# Patient Record
Sex: Female | Born: 1935
Health system: Southern US, Community
[De-identification: ages and names within clinical notes are randomized; demographics above are authoritative.]

## PROBLEM LIST (undated history)

## (undated) DIAGNOSIS — I10 Essential (primary) hypertension: Secondary | ICD-10-CM

## (undated) DIAGNOSIS — I059 Rheumatic mitral valve disease, unspecified: Secondary | ICD-10-CM

## (undated) DIAGNOSIS — M810 Age-related osteoporosis without current pathological fracture: Secondary | ICD-10-CM

## (undated) DIAGNOSIS — F4310 Post-traumatic stress disorder, unspecified: Secondary | ICD-10-CM

## (undated) DIAGNOSIS — F32A Depression, unspecified: Secondary | ICD-10-CM

## (undated) DIAGNOSIS — R29898 Other symptoms and signs involving the musculoskeletal system: Secondary | ICD-10-CM

## (undated) DIAGNOSIS — K219 Gastro-esophageal reflux disease without esophagitis: Secondary | ICD-10-CM

## (undated) DIAGNOSIS — G4730 Sleep apnea, unspecified: Secondary | ICD-10-CM

## (undated) DIAGNOSIS — R251 Tremor, unspecified: Secondary | ICD-10-CM

## (undated) DIAGNOSIS — E7800 Pure hypercholesterolemia, unspecified: Secondary | ICD-10-CM

## (undated) DIAGNOSIS — G8929 Other chronic pain: Secondary | ICD-10-CM

## (undated) DIAGNOSIS — T753XXA Motion sickness, initial encounter: Secondary | ICD-10-CM

## (undated) DIAGNOSIS — Z8719 Personal history of other diseases of the digestive system: Secondary | ICD-10-CM

## (undated) DIAGNOSIS — Z8711 Personal history of peptic ulcer disease: Secondary | ICD-10-CM

## (undated) DIAGNOSIS — H9190 Unspecified hearing loss, unspecified ear: Secondary | ICD-10-CM

## (undated) DIAGNOSIS — M549 Dorsalgia, unspecified: Secondary | ICD-10-CM

## (undated) DIAGNOSIS — R011 Cardiac murmur, unspecified: Secondary | ICD-10-CM

## (undated) DIAGNOSIS — F329 Major depressive disorder, single episode, unspecified: Secondary | ICD-10-CM

## (undated) DIAGNOSIS — J449 Chronic obstructive pulmonary disease, unspecified: Secondary | ICD-10-CM

## (undated) DIAGNOSIS — J3089 Other allergic rhinitis: Secondary | ICD-10-CM

## (undated) DIAGNOSIS — K589 Irritable bowel syndrome without diarrhea: Secondary | ICD-10-CM

## (undated) DIAGNOSIS — IMO0001 Reserved for inherently not codable concepts without codable children: Secondary | ICD-10-CM

## (undated) DIAGNOSIS — E039 Hypothyroidism, unspecified: Secondary | ICD-10-CM

## (undated) DIAGNOSIS — C259 Malignant neoplasm of pancreas, unspecified: Secondary | ICD-10-CM

## (undated) HISTORY — PX: TONSILLECTOMY: SUR1361

## (undated) HISTORY — PX: EXCISION OF SKIN TAG: SHX6270

## (undated) HISTORY — PX: NASAL SEPTUM SURGERY: SHX37

## (undated) HISTORY — PX: BACK SURGERY: SHX140

## (undated) HISTORY — PX: MASTOIDECTOMY: SHX711

## (undated) HISTORY — PX: ABDOMINAL HYSTERECTOMY: SHX81

## (undated) HISTORY — PX: EYE SURGERY: SHX253

---

## 2007-10-29 ENCOUNTER — Inpatient Hospital Stay (HOSPITAL_COMMUNITY): Admission: RE | Admit: 2007-10-29 | Discharge: 2007-11-03 | Payer: Self-pay | Admitting: Neurosurgery

## 2007-10-29 IMAGING — CR DG LUMBAR SPINE COMPLETE 4+V
1 series · 1 of 1 positions shown · non-contrast
Comparison: none

CLINICAL DATA: L3 to S1 laminectomy and diskectomy with fusion. 
 LUMBAR SPINE - 4 VIEW:

[view not recorded]
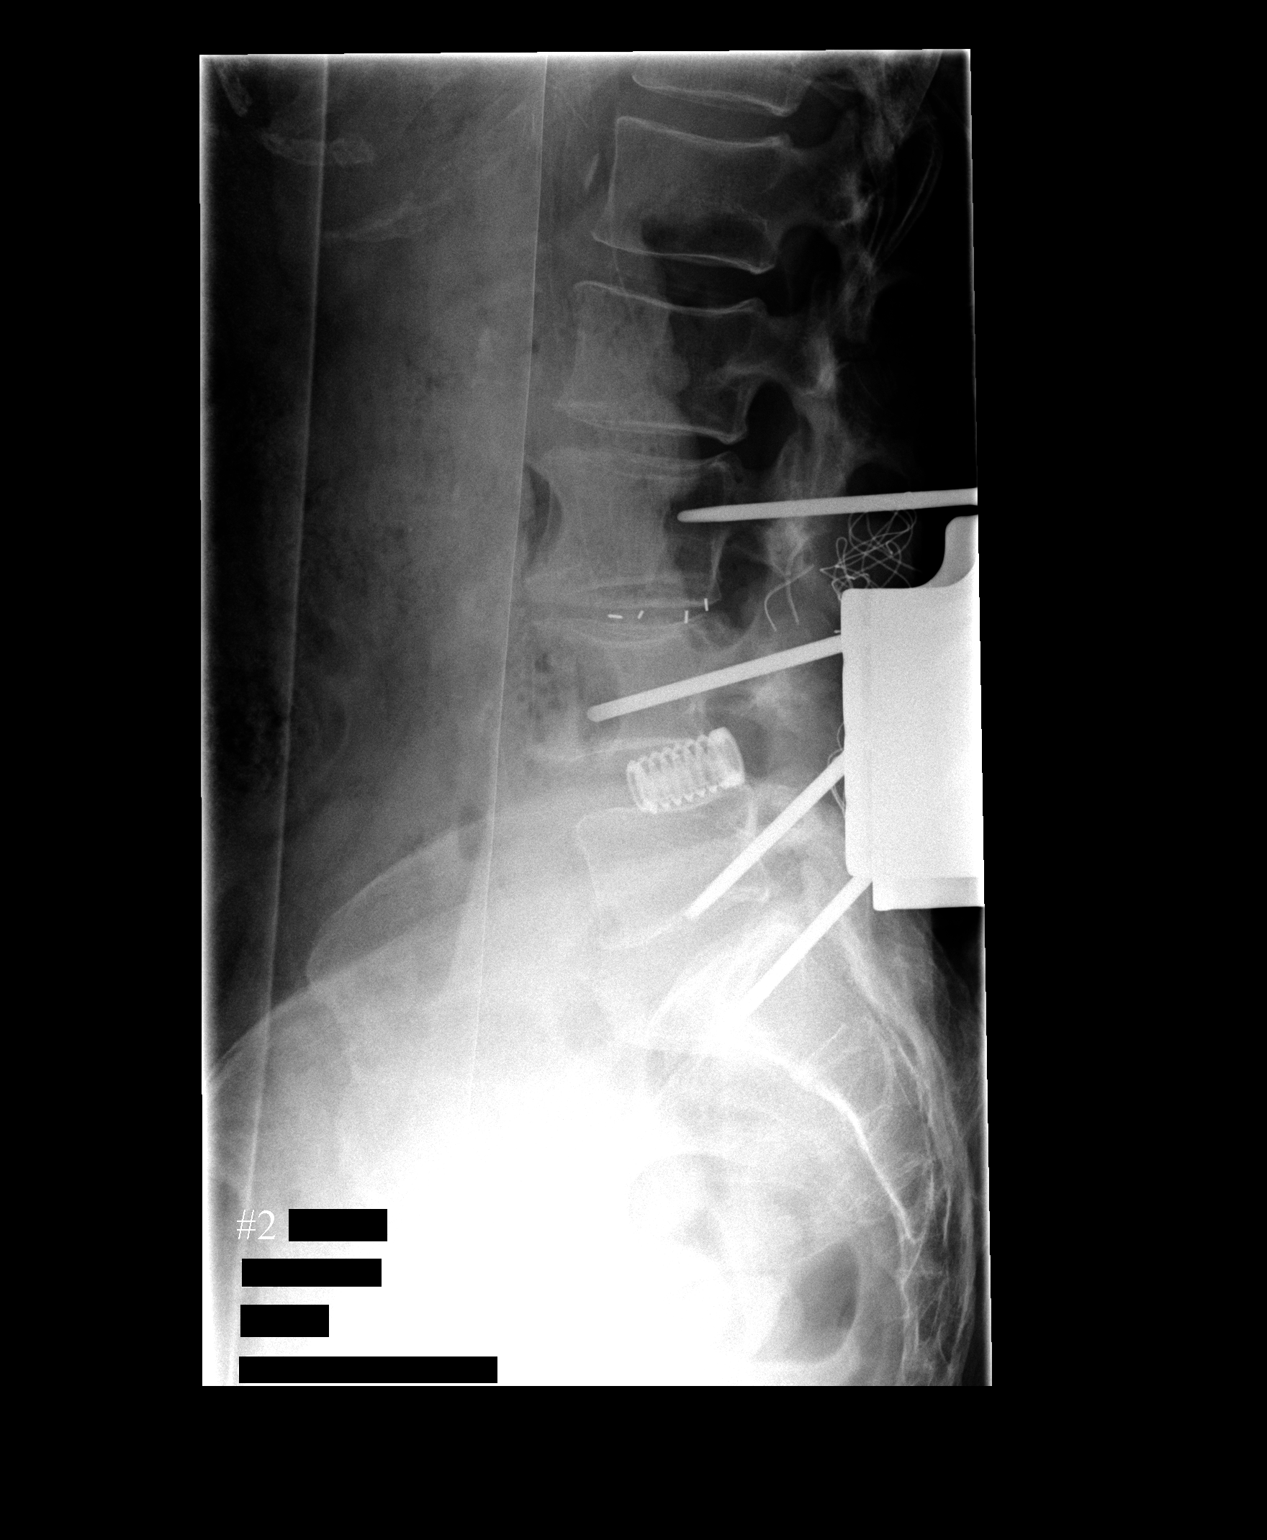

[1 of 1 positions shown; findings below may reference images not displayed]

FINDINGS: Four lateral views of the lumbar spine were returned from the operating room.  Film labeled #1 shows an instrument posteriorly positioned directed towards the L4-5 interspace.  A 2nd film shows introducers placed at L3, L4, L5 and S1 levels with Ray cage at L4-5 level.  A 3rd film shows additional transpedicular screws and introducer present at these levels.  A 4th film shows posterior fusion from L3 to S1 with Ray cage fusion at L4-5.
IMPRESSION: As discussed above.

## 2011-04-18 NOTE — H&P (Signed)
Crystal Arnold, Crystal Arnold NO.:  [id number]   MEDICAL RECORD NO.:  [id number]          PATIENT TYPE:  INP   LOCATION:  3109                         FACILITY:  [facility]   PHYSICIAN:  Payton Doughty, M.D.      DATE OF BIRTH:  [birth year]   DATE OF ADMISSION:  10/29/2007  DATE OF DISCHARGE:                              HISTORY & PHYSICAL   ADMISSION DIAGNOSIS:  Severe lumbar spondylosis.   BODY:  A 75 year old right-handed white lady in 2006 underwent removal  of a synovial cyst at L4-5.  A month later she had evacuation of  hematoma on the left side L4-5.  She has had significant leg pain since  then and torsion over the past couple of months.  She has atrophy noted  in her left leg.  MRI shows spondylolisthesis of grade 1 at L4 and L5  with a partial fracture at the left side at 4.  There is significant  stenosis at L3-4 and degenerative disk disease at L1-2.  She is now  admitted for a lumbar fusion.   PAST MEDICAL HISTORY:  Marked for:  1. COPD.  2. Depression.   MEDICATIONS:  Uses:  1. Neurontin 600 t.i.d.  2. Celebrex 200 daily.  3. Zocor 80 mg daily.  4. Thyroxine 0.112 mg daily.  5. Singulair 10 mg daily.  6. Halcion on a p.r.n. basis.  7. Nexium 20 mg daily.  8. Hydrochlorothiazide 50 mg daily.  9. Paxil 20 mg daily.  10.Potassium 10 mEq daily.  11.Lisinopril 40 mg daily.  12.Zyrtec 10 mg daily.  13.Maxzide 7/50 once a day.  14.Plendil 2.5 mg daily.  15.Reglan 10 mg q.i.d.  16.Vitamins.  17.Provigil 200 mg once a day.  18.Hydrocodone on a p.r.n. basis.   ALLERGIES:  NONE.   PAST SURGICAL HISTORY:  As noted above.   SOCIAL HISTORY:  She does not smoke or drink.  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   FAMILY HISTORY:  Both parents are deceased with a history of  hypertension and stroke.   REVIEW OF SYSTEMS:  Remarkable for ear pain, tenderness, nasal  congestion, sinus problems, headache, mouth sores, hypertension, heart  murmur,  hypercholesterolemia, swelling in her hands and feet, COPD,  shortness of breath, indigestion, UTI, difficulty starting urinary  stream, leg weakness on the left side, back pain, leg pain, arthritis,  neck pain, difficulty with memory, concentration, coordination, anxiety,  depression, thyroid disease, food and nasal allergies.   PHYSICAL EXAMINATION:  HEENT:  Within normal limits.  She has reasonable  range of motion in her neck.  CHEST:  Clear.  CARDIAC:  Regular rate and rhythm.  ABDOMEN:  Small, nontender.  No hepatosplenomegaly.  EXTREMITIES:  Without clubbing or cyanosis.  GU:  Deferred.  Peripheral pulses are good.  NEUROLOGIC:  She is awake, alert, and oriented.  Cranial nerves II-XII  are intact.  MOTOR:  Shows a 5/5 strength throughout the upper extremities. Lower  extremities:  The right lower extremity has full strength in hip  flexors, knee extensors.  On the left a 4/5 dorsi  and plantar flexions  with normal strength.  There is some atrophy of the left calf.  Reflexes  are absent in the left knee, 1 at the right, 1 at the ankles  bilaterally.   MRI Shows a spondylolisthesis a grade 1 at L4-5 with a partial fracture  on the left side at L4.  L3-4 there is significant stenosis, worse off  to the left.  Related to joint thickening and ligamentum flavum  hypertrophy.  L4-5 the disk is degenerated and facet joints have some  arthropathy at the nerve roots, appear to exit freely.   CLINICAL IMPRESSION:  Left L4 radiculopathy secondary to  spondylolisthesis.   PLAN:  Decompression stabilization.  The partial defect is probably why  she has a slip at L4-5 and L3-4 cannot be left alone.  She will be fused  from L3-S1, decompressed L3-4, L4-5, and L5-S1.  The risks and benefits  have been discussed with her and she wishes to proceed.           ______________________________  Payton Doughty, M.D.     MWR/MEDQ  D:  10/29/2007  T:  10/30/2007  Job:  [id number]

## 2011-04-18 NOTE — Op Note (Signed)
NAMEDORENA, DORFMAN NO.:  [id number]   MEDICAL RECORD NO.:  [id number]          PATIENT TYPE:  INP   LOCATION:  3172                         FACILITY:  [facility]   PHYSICIAN:  Payton Doughty, M.D.      DATE OF BIRTH:  1936/01/02   DATE OF PROCEDURE:  10/29/2007  DATE OF DISCHARGE:                               OPERATIVE REPORT   PREOPERATIVE DIAGNOSES:  Spondylolisthesis of L4-L5, spondylosis L3-4,  L5-S1.   POSTOPERATIVE DIAGNOSES:  Spondylolisthesis of L4-L5, spondylosis L3-4,  L5-S1.   PROCEDURE:  L3-4, L4-5 laminectomy, diskectomy, posterior lumbar  interbody fusion with Ray threaded fusion cage and PEEK cage,  L5-S1  left laminectomy,  L3 to S1 segmental pedicle screw fixation and L3 to  S1 posterolateral arthrodesis.   SERVICE:  Neurosurgery.   SURGEON:  Payton Doughty, M.D.   ANESTHESIA:  General endotracheal.   PREPARATION:  Prepped and scrubbed with alcohol wipe.   COMPLICATIONS:  None.   NURSE ASSISTANT:  [place].   DOCTOR ASSISTANT:  Venetia Maxon.   A 75 year old lady who had had a prior laminectomy at L4-5 for what was  reported to be a synovial cyst.  She has had persistent left leg pain  and has atrophy of the left leg and weakness.  She was taken to the  operating room __________ intubated, placed prone on the operating  table. Following shave, prep and  draped in the usual sterile fashion,  the old skin incision was reopened and extended in the lamina and  transverse processes of L3, L4, L5 and S1 were exposed bilaterally in  the subperiosteal plane.  Intraoperative x-ray confirmed correct level.  During the dissection, it was observed on the left side that the lamina  and inferior facet of L4 were completely removed and the L4 root was  bulging laterally out of the space where the facet had been removed and  there had been significant injury to the left L4 root.  The remaining  root was carefully dissected but it was evident that there was  extensive  scarring and defect in the dorsal surface of this root.  It was not  further dissected and it was carefully protected throughout the  remainder of the case. At L3 and L5 on the left side, the pars and  reticularis lamina and inferior facet were removed and this allowed  dissection of the 3 and 5 roots on the left side which were completely  decompressed. This lady had no right leg pain. On the right side at L3-  4, the pars reticularis and inferior facet were removed and at L4 , the  pars reticularis and inferior facet were removed.  This allowed access  to the lateral portion of the disk space.  Dissecting underneath the  lamina at L4, the left side of the L4 thecal sac was dissected free out  to the axilla of the L4 root.  The L5 root was identified and it also  __________  freely. On the right side, the L5 lamina was left intact  because on the images there was no compression  of the right L5 root. A  transverse Ray Cage was placed and L4-5 to both reduce the slip there as  well as to open the interpedicular distance.  At L3-4, the PEEK cages  were placed 7 mm to allow distraction at that level.  Once the cages  were placed and packed with bone graft harvested from the facet joints.  Pedicle screws were placed at L3, L4, L5 and S1 bilaterally.  They were  connected to the rod and locked in place. The transverse processes and  sacral ala as well as the right-sided lamina of L4 and L5 were  decorticated with the high-speed drill and packed with BMP on the  extender matrix.  Final films showed good placement of the cages,  screws and rods.  Successive layers of #0 Vicryl, 2-0 Vicryl, and 3-0  nylon were used to close.  Betadine Telfa dressing was applied and the  patient returned to the recovery room in good condition.           ______________________________  Payton Doughty, M.D.     MWR/MEDQ  D:  10/29/2007  T:  10/30/2007  Job:  [id number]

## 2011-04-18 NOTE — Consult Note (Signed)
[name], [name]               ACCOUNT NO.:  [id number]   MEDICAL RECORD NO.:  [id number]          PATIENT TYPE:  INP   LOCATION:  3109                         FACILITY:  [facility]   PHYSICIAN:  Wilber Bihari. Caryn Section, M.D.   DATE OF BIRTH:  March 06, 1936   DATE OF CONSULTATION:  10/30/2007  DATE OF DISCHARGE:                                 CONSULTATION   HISTORY OF PRESENT ILLNESS:  [name] is a 75 year old white woman  who was admitted for back surgery yesterday.  Blood pressure has been  low following surgery.  Urine output has also been low.  BUN and  creatinine were 15/0.85 on October 23, 2007; 23/1.01 today, with  potassium 4.8.  Renal consult was requested by Dr. Channing Mutters.   PAST MEDICAL HISTORY:  1. Hypertension.  2. Hypothyroidism.  3. Mastoidectomy age 50.  4. Tonsillectomy age 14.  5. TAH (ovaries still in).  6. Two back surgeries 2 years ago.   MEDICATIONS:  1. Neurontin 600 b.i.d.  2. Celebrex 200 daily.  3. Zocor 80 daily.  4. Synthroid 0.11 daily.  5. Singulair 10 daily.  6. Halcion p.r.n.  7. Nexium 20 daily.  8. Hydrochlorothiazide 50 daily.  9. Paxil 20 daily.  10.Potassium 10 daily.  11.Lisinopril 40 daily.  12.Zyrtec 10 daily.  13.Maxzide 70/50 daily.  14.Plendil 2.5 daily.  15.Reglan 10 daily.  16.Proventil 200 daily.  17.Hydrocodone p.r.n.   REVIEW OF SYSTEMS:  No angina, claudication.  No melena, hematochezia.  No gross hematuria.  No renal colic.  No loss of consciousness.  No DOE,  no orthopnea, no palpitations.  No edema.   SOCIAL HISTORY:  She was born in [place].  She graduated from high  school, spent 2 years studying cosmetology.  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  She does not smoke cigarettes or drink alcohol.  Her husband is retired from the [employer]. Company secretary.  They have been married  for 51 years.   FAMILY HISTORY:  Her father died at 54 of MI.  Mother died  at age 73 of  old age.  She had 3 daughters, 1 of whom has hypertension.   PHYSICAL EXAM:  GENERAL:  She is awake, alert, friendly.  VITAL SIGNS:  Temperature 98.1, pulse 106, respirations 15, blood  pressure 94/39.  CHEST:  Clear.  HEART:  No rub.  ABDOMEN:  Nontender.  BACK:  Incision on back clean.  EXTREMITIES:  No rash, no edema.  No atheroembolic changes on toes.   LABORATORY DATA:  Sodium 129, potassium 4.8, chloride 98, CO2 25, BUN  23, CR 1.0.  Hemoglobin not available.  IV D5 half-normal saline +20  potassium chloride at 75 an hour.   IMPRESSION:  Oliguria with hypotension in patient on Celebrex, ACE  inhibitors, diuretics, calcium blockers.   PLAN:  Change IVF to normal saline, give at 160/hr x 4 hr.,  then back  to 80 an hour.  Check urinalysis.  Discontinue Plendil, lisinopril,  Maxzide, hydrochlorothiazide, (she should  not be on both  hydrochlorothiazide and Maxzide), and celebrex.  Check CBC and renal  profile  in the morning.  I would not worry about hyponatremia at this  time.      Richard F. Caryn Section, M.D.  Electronically Signed     RFF/MEDQ  D:  10/30/2007  T:  10/31/2007  Job:  [id number]

## 2011-04-21 NOTE — Discharge Summary (Signed)
NAMESEDALIA, Crystal Arnold               ACCOUNT NO.:  [id number]   MEDICAL RECORD NO.:  [id number]          PATIENT TYPE:  INP   LOCATION:  3003                         FACILITY:  [facility]   PHYSICIAN:  Payton Doughty, M.D.      DATE OF BIRTH:  November 27, 1936   DATE OF ADMISSION:  10/29/2007  DATE OF DISCHARGE:  11/03/2007                               DISCHARGE SUMMARY   ADMISSION DIAGNOSIS:  Lumbar spondylosis.   DISCHARGE DIAGNOSIS:  Lumbar spondylosis.   PROCEDURES:  1. L3-S1 segmental pedicle screws L3-S1.  2. Posterolateral arthrodesis L4-5.  3. Laminectomy diskectomy __________  .  4. L5-S1 left laminectomy.   COMPLICATIONS:  None.   HOSPITAL COURSE:  A 75 year old lady.  History and physical recounted in  the chart.  She had had a prior operation L4-5, had a hematoma, by  another doctor.  Developed pain in her back and was noted to have  probable absence __________  and atrophy of her left leg.  She was  admitted after ascertaining normal laboratory values, underwent a fusion  as noted above.  Postoperatively, she did reasonably well.  She spent 1  night in the ICU and then was transferred out to the floor.  Her  strength improved.  She had incisions dry.  She was transferred.  NG  tube was removed.  She had a small amount of renal failure that resolved  spontaneously.  November 30, she was out on the floor, eating, voiding  normally and was discharged home November 30.  She had hone PT arranged.   FOLLOWUP:  With me in the [facility] for suture removal.           ______________________________  Payton Doughty, M.D.     MWR/MEDQ  D:  12/25/2007  T:  12/25/2007  Job:  [id number]

## 2011-09-12 LAB — CBC
Hemoglobin: 10 — ABNORMAL LOW
Hemoglobin: 11.3 — ABNORMAL LOW
Hemoglobin: 12.7
MCHC: 34.5
MCV: 91.7
Platelets: 187
Platelets: 309
RBC: 2.24 — ABNORMAL LOW
RBC: 4.08
RDW: 13.8
WBC: 10
WBC: 6.2

## 2011-09-12 LAB — DIFFERENTIAL
Basophils Absolute: 0
Basophils Relative: 1
Eosinophils Absolute: 0.2
Eosinophils Relative: 3

## 2011-09-12 LAB — BASIC METABOLIC PANEL
BUN: 6
CO2: 28
CO2: 29
Calcium: 7.8 — ABNORMAL LOW
Calcium: 7.9 — ABNORMAL LOW
Chloride: 99
Chloride: 99
Creatinine, Ser: 0.63
Creatinine, Ser: 1.01
Creatinine, Ser: 1.28 — ABNORMAL HIGH
GFR calc Af Amer: >60
GFR calc Af Amer: >60
GFR calc non Af Amer: >60
Glucose, Bld: 113 — ABNORMAL HIGH
Glucose, Bld: 119 — ABNORMAL HIGH
Potassium: 4.1
Sodium: 132 — ABNORMAL LOW
Sodium: 132 — ABNORMAL LOW

## 2011-09-12 LAB — RENAL FUNCTION PANEL
CO2: 26
GFR calc non Af Amer: >60
Sodium: 132 — ABNORMAL LOW

## 2011-09-12 LAB — URINALYSIS, ROUTINE W REFLEX MICROSCOPIC
Bilirubin Urine: NEGATIVE
Bilirubin Urine: NEGATIVE
Glucose, UA: NEGATIVE
Hgb urine dipstick: NEGATIVE
Nitrite: NEGATIVE
Protein, ur: NEGATIVE
Protein, ur: NEGATIVE
Specific Gravity, Urine: 1.012
pH: 5

## 2011-09-12 LAB — URINE MICROSCOPIC-ADD ON

## 2011-09-12 LAB — COMPREHENSIVE METABOLIC PANEL
BUN: 15
Calcium: 9.3
Chloride: 97
GFR calc Af Amer: >60
Sodium: 133 — ABNORMAL LOW
Total Bilirubin: 0.9
Total Protein: 6.4

## 2011-09-12 LAB — CROSSMATCH

## 2011-09-12 LAB — IRON AND TIBC
Iron: 26 — ABNORMAL LOW
Saturation Ratios: 21
TIBC: 125 — ABNORMAL LOW
UIBC: 99

## 2011-09-12 LAB — TYPE AND SCREEN
ABO/RH(D): O POS
Antibody Screen: NEGATIVE

## 2011-09-12 LAB — PROTIME-INR: Prothrombin Time: 12.3

## 2011-09-12 LAB — APTT: aPTT: 30

## 2012-01-23 DIAGNOSIS — H4011X0 Primary open-angle glaucoma, stage unspecified: Secondary | ICD-10-CM | POA: Diagnosis not present

## 2012-01-23 DIAGNOSIS — H3589 Other specified retinal disorders: Secondary | ICD-10-CM | POA: Diagnosis not present

## 2012-01-23 DIAGNOSIS — H5340 Unspecified visual field defects: Secondary | ICD-10-CM | POA: Diagnosis not present

## 2012-04-26 DIAGNOSIS — F329 Major depressive disorder, single episode, unspecified: Secondary | ICD-10-CM | POA: Diagnosis not present

## 2012-04-26 DIAGNOSIS — F411 Generalized anxiety disorder: Secondary | ICD-10-CM | POA: Diagnosis not present

## 2012-04-26 DIAGNOSIS — I10 Essential (primary) hypertension: Secondary | ICD-10-CM | POA: Diagnosis not present

## 2012-04-26 DIAGNOSIS — E785 Hyperlipidemia, unspecified: Secondary | ICD-10-CM | POA: Diagnosis not present

## 2012-06-07 DIAGNOSIS — M818 Other osteoporosis without current pathological fracture: Secondary | ICD-10-CM | POA: Diagnosis not present

## 2012-06-07 DIAGNOSIS — W57XXXA Bitten or stung by nonvenomous insect and other nonvenomous arthropods, initial encounter: Secondary | ICD-10-CM | POA: Diagnosis not present

## 2012-06-07 DIAGNOSIS — D539 Nutritional anemia, unspecified: Secondary | ICD-10-CM | POA: Diagnosis not present

## 2012-06-07 DIAGNOSIS — R5381 Other malaise: Secondary | ICD-10-CM | POA: Diagnosis not present

## 2012-06-07 DIAGNOSIS — F329 Major depressive disorder, single episode, unspecified: Secondary | ICD-10-CM | POA: Diagnosis not present

## 2012-06-07 DIAGNOSIS — R5383 Other fatigue: Secondary | ICD-10-CM | POA: Diagnosis not present

## 2012-06-07 DIAGNOSIS — T148 Other injury of unspecified body region: Secondary | ICD-10-CM | POA: Diagnosis not present

## 2012-06-18 DIAGNOSIS — H4010X0 Unspecified open-angle glaucoma, stage unspecified: Secondary | ICD-10-CM | POA: Diagnosis not present

## 2012-06-21 ENCOUNTER — Other Ambulatory Visit: Payer: Self-pay | Admitting: Family Medicine

## 2012-06-21 DIAGNOSIS — N390 Urinary tract infection, site not specified: Secondary | ICD-10-CM | POA: Diagnosis not present

## 2012-06-23 LAB — URINE CULTURE

## 2012-07-01 DIAGNOSIS — I749 Embolism and thrombosis of unspecified artery: Secondary | ICD-10-CM | POA: Diagnosis not present

## 2012-07-14 ENCOUNTER — Ambulatory Visit: Payer: Self-pay | Admitting: Family Medicine

## 2012-07-14 DIAGNOSIS — N390 Urinary tract infection, site not specified: Secondary | ICD-10-CM | POA: Diagnosis not present

## 2012-07-14 LAB — URINALYSIS, COMPLETE
Bilirubin,UR: NEGATIVE
Glucose,UR: NEGATIVE mg/dL (ref 0–75)
Ketone: NEGATIVE
Nitrite: POSITIVE
Ph: 7 (ref 4.5–8.0)
Protein: NEGATIVE
Specific Gravity: 1.01 (ref 1.003–1.030)
Squamous Epithelial: NONE SEEN

## 2012-07-17 LAB — URINE CULTURE

## 2012-07-25 DIAGNOSIS — H40039 Anatomical narrow angle, unspecified eye: Secondary | ICD-10-CM | POA: Diagnosis not present

## 2012-07-30 DIAGNOSIS — R5383 Other fatigue: Secondary | ICD-10-CM | POA: Diagnosis not present

## 2012-07-30 DIAGNOSIS — R0602 Shortness of breath: Secondary | ICD-10-CM | POA: Diagnosis not present

## 2012-07-30 DIAGNOSIS — R5381 Other malaise: Secondary | ICD-10-CM | POA: Diagnosis not present

## 2012-08-08 DIAGNOSIS — I059 Rheumatic mitral valve disease, unspecified: Secondary | ICD-10-CM | POA: Diagnosis not present

## 2012-08-08 DIAGNOSIS — I369 Nonrheumatic tricuspid valve disorder, unspecified: Secondary | ICD-10-CM | POA: Diagnosis not present

## 2012-08-08 DIAGNOSIS — R5381 Other malaise: Secondary | ICD-10-CM | POA: Diagnosis not present

## 2012-08-08 DIAGNOSIS — R0602 Shortness of breath: Secondary | ICD-10-CM | POA: Diagnosis not present

## 2012-08-08 DIAGNOSIS — R61 Generalized hyperhidrosis: Secondary | ICD-10-CM | POA: Diagnosis not present

## 2012-08-12 DIAGNOSIS — H40039 Anatomical narrow angle, unspecified eye: Secondary | ICD-10-CM | POA: Diagnosis not present

## 2012-08-13 DIAGNOSIS — R5381 Other malaise: Secondary | ICD-10-CM | POA: Diagnosis not present

## 2012-08-13 DIAGNOSIS — R0602 Shortness of breath: Secondary | ICD-10-CM | POA: Diagnosis not present

## 2012-08-13 DIAGNOSIS — R5383 Other fatigue: Secondary | ICD-10-CM | POA: Diagnosis not present

## 2012-09-28 ENCOUNTER — Ambulatory Visit: Payer: Self-pay | Admitting: Physician Assistant

## 2012-09-28 DIAGNOSIS — K219 Gastro-esophageal reflux disease without esophagitis: Secondary | ICD-10-CM | POA: Diagnosis not present

## 2012-09-28 DIAGNOSIS — Z23 Encounter for immunization: Secondary | ICD-10-CM | POA: Diagnosis not present

## 2012-09-28 DIAGNOSIS — N390 Urinary tract infection, site not specified: Secondary | ICD-10-CM | POA: Diagnosis not present

## 2012-09-28 DIAGNOSIS — F329 Major depressive disorder, single episode, unspecified: Secondary | ICD-10-CM | POA: Diagnosis not present

## 2012-09-28 DIAGNOSIS — Z79899 Other long term (current) drug therapy: Secondary | ICD-10-CM | POA: Diagnosis not present

## 2012-09-28 DIAGNOSIS — I10 Essential (primary) hypertension: Secondary | ICD-10-CM | POA: Diagnosis not present

## 2012-09-28 LAB — URINALYSIS, COMPLETE
Nitrite: POSITIVE
Ph: 8 (ref 4.5–8.0)

## 2012-09-30 LAB — URINE CULTURE

## 2012-12-05 DIAGNOSIS — E785 Hyperlipidemia, unspecified: Secondary | ICD-10-CM | POA: Diagnosis not present

## 2012-12-05 DIAGNOSIS — M818 Other osteoporosis without current pathological fracture: Secondary | ICD-10-CM | POA: Diagnosis not present

## 2012-12-05 DIAGNOSIS — F411 Generalized anxiety disorder: Secondary | ICD-10-CM | POA: Diagnosis not present

## 2012-12-05 DIAGNOSIS — E038 Other specified hypothyroidism: Secondary | ICD-10-CM | POA: Diagnosis not present

## 2012-12-05 DIAGNOSIS — Z79899 Other long term (current) drug therapy: Secondary | ICD-10-CM | POA: Diagnosis not present

## 2012-12-05 DIAGNOSIS — G4700 Insomnia, unspecified: Secondary | ICD-10-CM | POA: Diagnosis not present

## 2012-12-05 DIAGNOSIS — F329 Major depressive disorder, single episode, unspecified: Secondary | ICD-10-CM | POA: Diagnosis not present

## 2012-12-05 DIAGNOSIS — M25559 Pain in unspecified hip: Secondary | ICD-10-CM | POA: Diagnosis not present

## 2012-12-05 DIAGNOSIS — M545 Low back pain: Secondary | ICD-10-CM | POA: Diagnosis not present

## 2012-12-05 DIAGNOSIS — I10 Essential (primary) hypertension: Secondary | ICD-10-CM | POA: Diagnosis not present

## 2012-12-05 DIAGNOSIS — F0390 Unspecified dementia without behavioral disturbance: Secondary | ICD-10-CM | POA: Diagnosis not present

## 2013-01-02 DIAGNOSIS — H40009 Preglaucoma, unspecified, unspecified eye: Secondary | ICD-10-CM | POA: Diagnosis not present

## 2013-02-21 DIAGNOSIS — J209 Acute bronchitis, unspecified: Secondary | ICD-10-CM | POA: Diagnosis not present

## 2013-04-17 ENCOUNTER — Ambulatory Visit: Payer: Self-pay

## 2013-04-17 DIAGNOSIS — N3090 Cystitis, unspecified without hematuria: Secondary | ICD-10-CM | POA: Diagnosis not present

## 2013-04-17 LAB — URINALYSIS, COMPLETE
Bacteria: NEGATIVE
Bilirubin,UR: NEGATIVE
Blood: NEGATIVE
Glucose,UR: NEGATIVE mg/dL (ref 0–75)
Ketone: NEGATIVE
Leukocyte Esterase: NEGATIVE
Protein: NEGATIVE
Renal Epithelial: NONE SEEN
Specific Gravity: 1.005 (ref 1.003–1.030)
Squamous Epithelial: NONE SEEN
Transitional Epi: NONE SEEN

## 2013-05-28 DIAGNOSIS — E559 Vitamin D deficiency, unspecified: Secondary | ICD-10-CM | POA: Diagnosis not present

## 2013-05-28 DIAGNOSIS — E785 Hyperlipidemia, unspecified: Secondary | ICD-10-CM | POA: Diagnosis not present

## 2013-05-28 DIAGNOSIS — F0390 Unspecified dementia without behavioral disturbance: Secondary | ICD-10-CM | POA: Diagnosis not present

## 2013-05-28 DIAGNOSIS — J309 Allergic rhinitis, unspecified: Secondary | ICD-10-CM | POA: Diagnosis not present

## 2013-05-28 DIAGNOSIS — Z6825 Body mass index (BMI) 25.0-25.9, adult: Secondary | ICD-10-CM | POA: Diagnosis not present

## 2013-07-03 DIAGNOSIS — H4010X0 Unspecified open-angle glaucoma, stage unspecified: Secondary | ICD-10-CM | POA: Diagnosis not present

## 2013-07-09 ENCOUNTER — Ambulatory Visit: Payer: Self-pay | Admitting: Family Medicine

## 2013-07-09 DIAGNOSIS — K219 Gastro-esophageal reflux disease without esophagitis: Secondary | ICD-10-CM | POA: Diagnosis not present

## 2013-07-09 DIAGNOSIS — I10 Essential (primary) hypertension: Secondary | ICD-10-CM | POA: Diagnosis not present

## 2013-07-09 DIAGNOSIS — F329 Major depressive disorder, single episode, unspecified: Secondary | ICD-10-CM | POA: Diagnosis not present

## 2013-07-09 DIAGNOSIS — R5381 Other malaise: Secondary | ICD-10-CM | POA: Diagnosis not present

## 2013-07-09 DIAGNOSIS — Z90710 Acquired absence of both cervix and uterus: Secondary | ICD-10-CM | POA: Diagnosis not present

## 2013-07-09 LAB — COMPREHENSIVE METABOLIC PANEL
Albumin: 3.9 g/dL (ref 3.4–5.0)
Alkaline Phosphatase: 72 U/L (ref 50–136)
Anion Gap: 9 (ref 7–16)
BUN: 16 mg/dL (ref 7–18)
Calcium, Total: 9 mg/dL (ref 8.5–10.1)
Chloride: 94 mmol/L — ABNORMAL LOW (ref 98–107)
Co2: 29 mmol/L (ref 21–32)
Creatinine: 0.86 mg/dL (ref 0.60–1.30)
EGFR (African American): >60
Glucose: 101 mg/dL — ABNORMAL HIGH (ref 65–99)
SGOT(AST): 23 U/L (ref 15–37)
SGPT (ALT): 38 U/L (ref 12–78)
Sodium: 132 mmol/L — ABNORMAL LOW (ref 136–145)
Total Protein: 7 g/dL (ref 6.4–8.2)

## 2013-07-09 LAB — CBC WITH DIFFERENTIAL/PLATELET
Eosinophil #: 0.2 10*3/uL (ref 0.0–0.7)
Lymphocyte #: 2 10*3/uL (ref 1.0–3.6)
MCH: 30.4 pg (ref 26.0–34.0)
MCHC: 33.8 g/dL (ref 32.0–36.0)
MCV: 90 fL (ref 80–100)
Neutrophil #: 3.9 10*3/uL (ref 1.4–6.5)
RBC: 4.49 10*6/uL (ref 3.80–5.20)
WBC: 7 10*3/uL (ref 3.6–11.0)

## 2013-07-09 LAB — URINALYSIS, COMPLETE
Bilirubin,UR: NEGATIVE
Specific Gravity: 1.01 (ref 1.003–1.030)

## 2013-07-18 ENCOUNTER — Ambulatory Visit: Payer: Self-pay | Admitting: Family Medicine

## 2013-07-18 DIAGNOSIS — Z79899 Other long term (current) drug therapy: Secondary | ICD-10-CM | POA: Diagnosis not present

## 2013-07-18 DIAGNOSIS — K219 Gastro-esophageal reflux disease without esophagitis: Secondary | ICD-10-CM | POA: Diagnosis not present

## 2013-07-18 DIAGNOSIS — Z90710 Acquired absence of both cervix and uterus: Secondary | ICD-10-CM | POA: Diagnosis not present

## 2013-07-18 DIAGNOSIS — N390 Urinary tract infection, site not specified: Secondary | ICD-10-CM | POA: Diagnosis not present

## 2013-07-18 DIAGNOSIS — F329 Major depressive disorder, single episode, unspecified: Secondary | ICD-10-CM | POA: Diagnosis not present

## 2013-07-18 DIAGNOSIS — I10 Essential (primary) hypertension: Secondary | ICD-10-CM | POA: Diagnosis not present

## 2013-07-18 LAB — URINALYSIS, COMPLETE
Bilirubin,UR: NEGATIVE
Nitrite: POSITIVE
Protein: NEGATIVE
Specific Gravity: 1.01 (ref 1.003–1.030)

## 2013-07-20 LAB — URINE CULTURE

## 2013-08-21 DIAGNOSIS — R5381 Other malaise: Secondary | ICD-10-CM | POA: Diagnosis not present

## 2013-08-21 DIAGNOSIS — I10 Essential (primary) hypertension: Secondary | ICD-10-CM | POA: Diagnosis not present

## 2013-08-21 DIAGNOSIS — E785 Hyperlipidemia, unspecified: Secondary | ICD-10-CM | POA: Diagnosis not present

## 2013-10-21 DIAGNOSIS — R0602 Shortness of breath: Secondary | ICD-10-CM | POA: Diagnosis not present

## 2013-10-28 DIAGNOSIS — M5137 Other intervertebral disc degeneration, lumbosacral region: Secondary | ICD-10-CM | POA: Diagnosis not present

## 2013-10-28 DIAGNOSIS — M25559 Pain in unspecified hip: Secondary | ICD-10-CM | POA: Diagnosis not present

## 2013-10-28 DIAGNOSIS — M899 Disorder of bone, unspecified: Secondary | ICD-10-CM | POA: Diagnosis not present

## 2013-10-29 DIAGNOSIS — Z23 Encounter for immunization: Secondary | ICD-10-CM | POA: Diagnosis not present

## 2013-10-29 DIAGNOSIS — I10 Essential (primary) hypertension: Secondary | ICD-10-CM | POA: Diagnosis not present

## 2013-10-29 DIAGNOSIS — M545 Low back pain, unspecified: Secondary | ICD-10-CM | POA: Diagnosis not present

## 2013-10-29 DIAGNOSIS — Z79899 Other long term (current) drug therapy: Secondary | ICD-10-CM | POA: Diagnosis not present

## 2013-10-29 DIAGNOSIS — E785 Hyperlipidemia, unspecified: Secondary | ICD-10-CM | POA: Diagnosis not present

## 2013-10-29 DIAGNOSIS — M818 Other osteoporosis without current pathological fracture: Secondary | ICD-10-CM | POA: Diagnosis not present

## 2013-10-29 DIAGNOSIS — E559 Vitamin D deficiency, unspecified: Secondary | ICD-10-CM | POA: Diagnosis not present

## 2013-10-29 DIAGNOSIS — N390 Urinary tract infection, site not specified: Secondary | ICD-10-CM | POA: Diagnosis not present

## 2013-11-14 DIAGNOSIS — R252 Cramp and spasm: Secondary | ICD-10-CM | POA: Diagnosis not present

## 2013-11-14 DIAGNOSIS — R229 Localized swelling, mass and lump, unspecified: Secondary | ICD-10-CM | POA: Diagnosis not present

## 2013-12-01 DIAGNOSIS — R0609 Other forms of dyspnea: Secondary | ICD-10-CM | POA: Diagnosis not present

## 2013-12-01 DIAGNOSIS — G4710 Hypersomnia, unspecified: Secondary | ICD-10-CM | POA: Diagnosis not present

## 2013-12-30 DIAGNOSIS — G4733 Obstructive sleep apnea (adult) (pediatric): Secondary | ICD-10-CM | POA: Diagnosis not present

## 2014-01-01 DIAGNOSIS — H4010X0 Unspecified open-angle glaucoma, stage unspecified: Secondary | ICD-10-CM | POA: Diagnosis not present

## 2014-01-15 DIAGNOSIS — R69 Illness, unspecified: Secondary | ICD-10-CM | POA: Diagnosis not present

## 2014-02-17 DIAGNOSIS — E038 Other specified hypothyroidism: Secondary | ICD-10-CM | POA: Diagnosis not present

## 2014-02-17 DIAGNOSIS — N390 Urinary tract infection, site not specified: Secondary | ICD-10-CM | POA: Diagnosis not present

## 2014-02-17 DIAGNOSIS — E785 Hyperlipidemia, unspecified: Secondary | ICD-10-CM | POA: Diagnosis not present

## 2014-02-17 DIAGNOSIS — F411 Generalized anxiety disorder: Secondary | ICD-10-CM | POA: Diagnosis not present

## 2014-03-12 ENCOUNTER — Ambulatory Visit: Payer: Self-pay

## 2014-03-12 DIAGNOSIS — I10 Essential (primary) hypertension: Secondary | ICD-10-CM | POA: Diagnosis not present

## 2014-03-12 DIAGNOSIS — J8410 Pulmonary fibrosis, unspecified: Secondary | ICD-10-CM | POA: Diagnosis not present

## 2014-03-12 DIAGNOSIS — F3289 Other specified depressive episodes: Secondary | ICD-10-CM | POA: Diagnosis not present

## 2014-03-12 DIAGNOSIS — R5381 Other malaise: Secondary | ICD-10-CM | POA: Diagnosis not present

## 2014-03-12 DIAGNOSIS — K219 Gastro-esophageal reflux disease without esophagitis: Secondary | ICD-10-CM | POA: Diagnosis not present

## 2014-03-12 DIAGNOSIS — R5383 Other fatigue: Secondary | ICD-10-CM | POA: Diagnosis not present

## 2014-03-12 DIAGNOSIS — Z79899 Other long term (current) drug therapy: Secondary | ICD-10-CM | POA: Diagnosis not present

## 2014-03-12 DIAGNOSIS — F329 Major depressive disorder, single episode, unspecified: Secondary | ICD-10-CM | POA: Diagnosis not present

## 2014-03-12 DIAGNOSIS — J449 Chronic obstructive pulmonary disease, unspecified: Secondary | ICD-10-CM | POA: Diagnosis not present

## 2014-03-12 DIAGNOSIS — Z90710 Acquired absence of both cervix and uterus: Secondary | ICD-10-CM | POA: Diagnosis not present

## 2014-03-12 LAB — CBC WITH DIFFERENTIAL/PLATELET
BASOS ABS: 0 10*3/uL (ref 0.0–0.1)
Basophil %: 0.6 %
Eosinophil #: 0.1 10*3/uL (ref 0.0–0.7)
Eosinophil %: 1.1 %
HCT: 41.9 % (ref 35.0–47.0)
HGB: 14 g/dL (ref 12.0–16.0)
LYMPHS ABS: 2 10*3/uL (ref 1.0–3.6)
Lymphocyte %: 25.4 %
MCH: 31.2 pg (ref 26.0–34.0)
MCHC: 33.5 g/dL (ref 32.0–36.0)
MCV: 93 fL (ref 80–100)
Monocyte #: 0.6 x10 3/mm (ref 0.2–0.9)
Monocyte %: 7.2 %
NEUTROS ABS: 5.1 10*3/uL (ref 1.4–6.5)
NEUTROS PCT: 65.7 %
Platelet: 271 10*3/uL (ref 150–440)
RBC: 4.49 10*6/uL (ref 3.80–5.20)
RDW: 13.3 % (ref 11.5–14.5)
WBC: 7.7 10*3/uL (ref 3.6–11.0)

## 2014-03-12 LAB — URINALYSIS, COMPLETE
BACTERIA: NEGATIVE
Bilirubin,UR: NEGATIVE
Blood: NEGATIVE
GLUCOSE, UR: NEGATIVE mg/dL (ref 0–75)
KETONE: NEGATIVE
Leukocyte Esterase: NEGATIVE
NITRITE: NEGATIVE
Ph: 7 (ref 4.5–8.0)
Protein: NEGATIVE
SQUAMOUS EPITHELIAL: NONE SEEN
Specific Gravity: 1.01 (ref 1.003–1.030)

## 2014-03-12 LAB — COMPREHENSIVE METABOLIC PANEL
ALK PHOS: 68 U/L
ANION GAP: 13 (ref 7–16)
AST: 50 U/L — AB (ref 15–37)
Albumin: 3.6 g/dL (ref 3.4–5.0)
BUN: 15 mg/dL (ref 7–18)
Bilirubin,Total: 0.7 mg/dL (ref 0.2–1.0)
CALCIUM: 9.4 mg/dL (ref 8.5–10.1)
CREATININE: 0.42 mg/dL — AB (ref 0.60–1.30)
Chloride: 101 mmol/L (ref 98–107)
Co2: 23 mmol/L (ref 21–32)
EGFR (African American): >60
EGFR (Non-African Amer.): >60
Glucose: 148 mg/dL — ABNORMAL HIGH (ref 65–99)
Osmolality: 277 (ref 275–301)
POTASSIUM: 5.3 mmol/L — AB (ref 3.5–5.1)
SGPT (ALT): 35 U/L (ref 12–78)
Sodium: 137 mmol/L (ref 136–145)
Total Protein: 6.7 g/dL (ref 6.4–8.2)

## 2014-03-12 LAB — TSH: Thyroid Stimulating Horm: 0.056 u[IU]/mL — ABNORMAL LOW

## 2014-03-18 DIAGNOSIS — Z79899 Other long term (current) drug therapy: Secondary | ICD-10-CM | POA: Diagnosis not present

## 2014-03-18 DIAGNOSIS — E559 Vitamin D deficiency, unspecified: Secondary | ICD-10-CM | POA: Diagnosis not present

## 2014-03-18 DIAGNOSIS — R197 Diarrhea, unspecified: Secondary | ICD-10-CM | POA: Diagnosis not present

## 2014-03-18 DIAGNOSIS — G4700 Insomnia, unspecified: Secondary | ICD-10-CM | POA: Diagnosis not present

## 2014-03-18 DIAGNOSIS — I10 Essential (primary) hypertension: Secondary | ICD-10-CM | POA: Diagnosis not present

## 2014-03-19 DIAGNOSIS — G4700 Insomnia, unspecified: Secondary | ICD-10-CM | POA: Diagnosis not present

## 2014-04-20 DIAGNOSIS — K589 Irritable bowel syndrome without diarrhea: Secondary | ICD-10-CM | POA: Diagnosis not present

## 2014-04-20 DIAGNOSIS — N3000 Acute cystitis without hematuria: Secondary | ICD-10-CM | POA: Diagnosis not present

## 2014-04-23 DIAGNOSIS — G4733 Obstructive sleep apnea (adult) (pediatric): Secondary | ICD-10-CM | POA: Diagnosis not present

## 2014-04-28 DIAGNOSIS — G4733 Obstructive sleep apnea (adult) (pediatric): Secondary | ICD-10-CM | POA: Diagnosis not present

## 2014-04-28 DIAGNOSIS — E890 Postprocedural hypothyroidism: Secondary | ICD-10-CM | POA: Diagnosis not present

## 2014-04-30 DIAGNOSIS — G4733 Obstructive sleep apnea (adult) (pediatric): Secondary | ICD-10-CM | POA: Diagnosis not present

## 2014-04-30 DIAGNOSIS — H6120 Impacted cerumen, unspecified ear: Secondary | ICD-10-CM | POA: Diagnosis not present

## 2014-04-30 DIAGNOSIS — E890 Postprocedural hypothyroidism: Secondary | ICD-10-CM | POA: Diagnosis not present

## 2014-04-30 DIAGNOSIS — H905 Unspecified sensorineural hearing loss: Secondary | ICD-10-CM | POA: Diagnosis not present

## 2014-05-05 DIAGNOSIS — R259 Unspecified abnormal involuntary movements: Secondary | ICD-10-CM | POA: Diagnosis not present

## 2014-05-13 DIAGNOSIS — N390 Urinary tract infection, site not specified: Secondary | ICD-10-CM | POA: Diagnosis not present

## 2014-05-14 DIAGNOSIS — F411 Generalized anxiety disorder: Secondary | ICD-10-CM | POA: Diagnosis not present

## 2014-05-14 DIAGNOSIS — F329 Major depressive disorder, single episode, unspecified: Secondary | ICD-10-CM | POA: Diagnosis not present

## 2014-05-14 DIAGNOSIS — E038 Other specified hypothyroidism: Secondary | ICD-10-CM | POA: Diagnosis not present

## 2014-05-14 DIAGNOSIS — E785 Hyperlipidemia, unspecified: Secondary | ICD-10-CM | POA: Diagnosis not present

## 2014-05-14 DIAGNOSIS — F3289 Other specified depressive episodes: Secondary | ICD-10-CM | POA: Diagnosis not present

## 2014-06-15 DIAGNOSIS — F3289 Other specified depressive episodes: Secondary | ICD-10-CM | POA: Diagnosis not present

## 2014-06-15 DIAGNOSIS — F411 Generalized anxiety disorder: Secondary | ICD-10-CM | POA: Diagnosis not present

## 2014-06-15 DIAGNOSIS — F329 Major depressive disorder, single episode, unspecified: Secondary | ICD-10-CM | POA: Diagnosis not present

## 2014-06-15 DIAGNOSIS — I499 Cardiac arrhythmia, unspecified: Secondary | ICD-10-CM | POA: Diagnosis not present

## 2014-06-15 DIAGNOSIS — M549 Dorsalgia, unspecified: Secondary | ICD-10-CM | POA: Diagnosis not present

## 2014-06-15 DIAGNOSIS — M545 Low back pain, unspecified: Secondary | ICD-10-CM | POA: Diagnosis not present

## 2014-06-15 DIAGNOSIS — Z981 Arthrodesis status: Secondary | ICD-10-CM | POA: Diagnosis not present

## 2014-06-15 DIAGNOSIS — M539 Dorsopathy, unspecified: Secondary | ICD-10-CM | POA: Diagnosis not present

## 2014-06-26 DIAGNOSIS — G4733 Obstructive sleep apnea (adult) (pediatric): Secondary | ICD-10-CM | POA: Diagnosis not present

## 2014-06-29 ENCOUNTER — Ambulatory Visit: Payer: Self-pay | Admitting: Family Medicine

## 2014-06-29 DIAGNOSIS — Z13820 Encounter for screening for osteoporosis: Secondary | ICD-10-CM | POA: Diagnosis not present

## 2014-06-29 DIAGNOSIS — M949 Disorder of cartilage, unspecified: Secondary | ICD-10-CM | POA: Diagnosis not present

## 2014-06-29 DIAGNOSIS — M899 Disorder of bone, unspecified: Secondary | ICD-10-CM | POA: Diagnosis not present

## 2014-06-29 DIAGNOSIS — M818 Other osteoporosis without current pathological fracture: Secondary | ICD-10-CM | POA: Diagnosis not present

## 2014-08-13 DIAGNOSIS — E038 Other specified hypothyroidism: Secondary | ICD-10-CM | POA: Diagnosis not present

## 2014-08-13 DIAGNOSIS — M545 Low back pain, unspecified: Secondary | ICD-10-CM | POA: Diagnosis not present

## 2014-08-13 DIAGNOSIS — F3289 Other specified depressive episodes: Secondary | ICD-10-CM | POA: Diagnosis not present

## 2014-08-13 DIAGNOSIS — E0530 Thyrotoxicosis from ectopic thyroid tissue without thyrotoxic crisis or storm: Secondary | ICD-10-CM | POA: Diagnosis not present

## 2014-08-13 DIAGNOSIS — F329 Major depressive disorder, single episode, unspecified: Secondary | ICD-10-CM | POA: Diagnosis not present

## 2014-12-22 DIAGNOSIS — I10 Essential (primary) hypertension: Secondary | ICD-10-CM | POA: Diagnosis not present

## 2014-12-22 DIAGNOSIS — F329 Major depressive disorder, single episode, unspecified: Secondary | ICD-10-CM | POA: Diagnosis not present

## 2014-12-22 DIAGNOSIS — Z79899 Other long term (current) drug therapy: Secondary | ICD-10-CM | POA: Diagnosis not present

## 2014-12-22 DIAGNOSIS — E559 Vitamin D deficiency, unspecified: Secondary | ICD-10-CM | POA: Diagnosis not present

## 2014-12-22 DIAGNOSIS — J302 Other seasonal allergic rhinitis: Secondary | ICD-10-CM | POA: Diagnosis not present

## 2014-12-22 DIAGNOSIS — K210 Gastro-esophageal reflux disease with esophagitis: Secondary | ICD-10-CM | POA: Diagnosis not present

## 2014-12-22 DIAGNOSIS — F419 Anxiety disorder, unspecified: Secondary | ICD-10-CM | POA: Diagnosis not present

## 2014-12-22 DIAGNOSIS — M810 Age-related osteoporosis without current pathological fracture: Secondary | ICD-10-CM | POA: Diagnosis not present

## 2014-12-22 DIAGNOSIS — E038 Other specified hypothyroidism: Secondary | ICD-10-CM | POA: Diagnosis not present

## 2014-12-22 DIAGNOSIS — M545 Low back pain: Secondary | ICD-10-CM | POA: Diagnosis not present

## 2014-12-22 DIAGNOSIS — E785 Hyperlipidemia, unspecified: Secondary | ICD-10-CM | POA: Diagnosis not present

## 2014-12-22 DIAGNOSIS — Z6824 Body mass index (BMI) 24.0-24.9, adult: Secondary | ICD-10-CM | POA: Diagnosis not present

## 2014-12-22 DIAGNOSIS — F0390 Unspecified dementia without behavioral disturbance: Secondary | ICD-10-CM | POA: Diagnosis not present

## 2015-01-13 DIAGNOSIS — L280 Lichen simplex chronicus: Secondary | ICD-10-CM | POA: Diagnosis not present

## 2015-01-13 DIAGNOSIS — L821 Other seborrheic keratosis: Secondary | ICD-10-CM | POA: Diagnosis not present

## 2015-01-13 DIAGNOSIS — Z711 Person with feared health complaint in whom no diagnosis is made: Secondary | ICD-10-CM | POA: Diagnosis not present

## 2015-02-23 DIAGNOSIS — H4011X1 Primary open-angle glaucoma, mild stage: Secondary | ICD-10-CM | POA: Diagnosis not present

## 2015-04-09 DIAGNOSIS — H33002 Unspecified retinal detachment with retinal break, left eye: Secondary | ICD-10-CM | POA: Diagnosis not present

## 2015-04-12 ENCOUNTER — Other Ambulatory Visit: Payer: Self-pay

## 2015-04-12 ENCOUNTER — Encounter
Admission: RE | Admit: 2015-04-12 | Discharge: 2015-04-12 | Disposition: A | Discharge status: Home / Self Care | Payer: Medicare Other | Source: Ambulatory Visit | Attending: Ophthalmology | Admitting: Ophthalmology

## 2015-04-12 DIAGNOSIS — H33012 Retinal detachment with single break, left eye: Secondary | ICD-10-CM | POA: Diagnosis not present

## 2015-04-12 DIAGNOSIS — K589 Irritable bowel syndrome without diarrhea: Secondary | ICD-10-CM | POA: Diagnosis not present

## 2015-04-12 DIAGNOSIS — I10 Essential (primary) hypertension: Secondary | ICD-10-CM | POA: Diagnosis not present

## 2015-04-12 DIAGNOSIS — H9193 Unspecified hearing loss, bilateral: Secondary | ICD-10-CM | POA: Diagnosis not present

## 2015-04-12 DIAGNOSIS — K289 Gastrojejunal ulcer, unspecified as acute or chronic, without hemorrhage or perforation: Secondary | ICD-10-CM | POA: Diagnosis not present

## 2015-04-12 DIAGNOSIS — M810 Age-related osteoporosis without current pathological fracture: Secondary | ICD-10-CM | POA: Diagnosis not present

## 2015-04-12 DIAGNOSIS — Z9109 Other allergy status, other than to drugs and biological substances: Secondary | ICD-10-CM | POA: Diagnosis not present

## 2015-04-12 DIAGNOSIS — R251 Tremor, unspecified: Secondary | ICD-10-CM | POA: Diagnosis not present

## 2015-04-12 DIAGNOSIS — E780 Pure hypercholesterolemia: Secondary | ICD-10-CM | POA: Diagnosis not present

## 2015-04-12 DIAGNOSIS — R011 Cardiac murmur, unspecified: Secondary | ICD-10-CM | POA: Diagnosis not present

## 2015-04-12 DIAGNOSIS — G8929 Other chronic pain: Secondary | ICD-10-CM | POA: Diagnosis not present

## 2015-04-12 DIAGNOSIS — M549 Dorsalgia, unspecified: Secondary | ICD-10-CM | POA: Diagnosis not present

## 2015-04-12 DIAGNOSIS — Z90710 Acquired absence of both cervix and uterus: Secondary | ICD-10-CM | POA: Diagnosis not present

## 2015-04-12 DIAGNOSIS — H33022 Retinal detachment with multiple breaks, left eye: Secondary | ICD-10-CM | POA: Diagnosis not present

## 2015-04-12 DIAGNOSIS — R0602 Shortness of breath: Secondary | ICD-10-CM | POA: Diagnosis not present

## 2015-04-12 DIAGNOSIS — G4730 Sleep apnea, unspecified: Secondary | ICD-10-CM | POA: Diagnosis not present

## 2015-04-12 HISTORY — DX: Reserved for inherently not codable concepts without codable children: IMO0001

## 2015-04-12 HISTORY — DX: Irritable bowel syndrome, unspecified: K58.9

## 2015-04-12 HISTORY — DX: Hypothyroidism, unspecified: E03.9

## 2015-04-12 HISTORY — DX: Sleep apnea, unspecified: G47.30

## 2015-04-12 HISTORY — DX: Cardiac murmur, unspecified: R01.1

## 2015-04-12 HISTORY — DX: Personal history of other diseases of the digestive system: Z87.19

## 2015-04-12 HISTORY — DX: Dorsalgia, unspecified: M54.9

## 2015-04-12 HISTORY — DX: Major depressive disorder, single episode, unspecified: F32.9

## 2015-04-12 HISTORY — DX: Other chronic pain: G89.29

## 2015-04-12 HISTORY — DX: Unspecified hearing loss, unspecified ear: H91.90

## 2015-04-12 HISTORY — DX: Gastro-esophageal reflux disease without esophagitis: K21.9

## 2015-04-12 HISTORY — DX: Rheumatic mitral valve disease, unspecified: I05.9

## 2015-04-12 HISTORY — DX: Personal history of peptic ulcer disease: Z87.11

## 2015-04-12 HISTORY — DX: Essential (primary) hypertension: I10

## 2015-04-12 HISTORY — DX: Depression, unspecified: F32.A

## 2015-04-12 LAB — POTASSIUM: POTASSIUM: 3.9 mmol/L (ref 3.5–5.1)

## 2015-04-13 NOTE — OR Nursing (Signed)
Ekg reviewed by anesthesia and ok

## 2015-04-14 ENCOUNTER — Ambulatory Visit: Payer: Medicare Other | Admitting: Anesthesiology

## 2015-04-14 ENCOUNTER — Ambulatory Visit
Admission: RE | Admit: 2015-04-14 | Discharge: 2015-04-14 | Disposition: A | Discharge status: Home / Self Care | Payer: Medicare Other | Source: Ambulatory Visit | Attending: Ophthalmology | Admitting: Ophthalmology

## 2015-04-14 ENCOUNTER — Encounter: Payer: Self-pay | Admitting: *Deleted

## 2015-04-14 ENCOUNTER — Encounter
Admission: RE | Disposition: A | Discharge status: Home / Self Care | Payer: Self-pay | Source: Ambulatory Visit | Attending: Ophthalmology

## 2015-04-14 DIAGNOSIS — M810 Age-related osteoporosis without current pathological fracture: Secondary | ICD-10-CM | POA: Insufficient documentation

## 2015-04-14 DIAGNOSIS — I10 Essential (primary) hypertension: Secondary | ICD-10-CM | POA: Insufficient documentation

## 2015-04-14 DIAGNOSIS — R0602 Shortness of breath: Secondary | ICD-10-CM | POA: Diagnosis not present

## 2015-04-14 DIAGNOSIS — H33022 Retinal detachment with multiple breaks, left eye: Secondary | ICD-10-CM | POA: Diagnosis not present

## 2015-04-14 DIAGNOSIS — E780 Pure hypercholesterolemia: Secondary | ICD-10-CM | POA: Insufficient documentation

## 2015-04-14 DIAGNOSIS — M549 Dorsalgia, unspecified: Secondary | ICD-10-CM | POA: Insufficient documentation

## 2015-04-14 DIAGNOSIS — G4730 Sleep apnea, unspecified: Secondary | ICD-10-CM | POA: Insufficient documentation

## 2015-04-14 DIAGNOSIS — R251 Tremor, unspecified: Secondary | ICD-10-CM | POA: Insufficient documentation

## 2015-04-14 DIAGNOSIS — K289 Gastrojejunal ulcer, unspecified as acute or chronic, without hemorrhage or perforation: Secondary | ICD-10-CM | POA: Insufficient documentation

## 2015-04-14 DIAGNOSIS — Z90710 Acquired absence of both cervix and uterus: Secondary | ICD-10-CM | POA: Insufficient documentation

## 2015-04-14 DIAGNOSIS — R011 Cardiac murmur, unspecified: Secondary | ICD-10-CM | POA: Diagnosis not present

## 2015-04-14 DIAGNOSIS — G8929 Other chronic pain: Secondary | ICD-10-CM | POA: Insufficient documentation

## 2015-04-14 DIAGNOSIS — Z9109 Other allergy status, other than to drugs and biological substances: Secondary | ICD-10-CM | POA: Insufficient documentation

## 2015-04-14 DIAGNOSIS — K589 Irritable bowel syndrome without diarrhea: Secondary | ICD-10-CM | POA: Insufficient documentation

## 2015-04-14 DIAGNOSIS — H9193 Unspecified hearing loss, bilateral: Secondary | ICD-10-CM | POA: Insufficient documentation

## 2015-04-14 HISTORY — PX: PARS PLANA VITRECTOMY: SHX2166

## 2015-04-14 SURGERY — PARS PLANA VITRECTOMY WITH 25 GAUGE
Anesthesia: Choice | Laterality: Left

## 2015-04-14 MED ORDER — CYCLOPENTOLATE HCL 2 % OP SOLN
OPHTHALMIC | Status: AC
  Administered 2015-04-14: 1 [drp] via OPHTHALMIC
  Filled 2015-04-14: qty 2

## 2015-04-14 MED ORDER — SODIUM CHLORIDE 0.9 % IV SOLN
INTRAVENOUS | Status: DC
  Administered 2015-04-14: 07:00:00 via INTRAVENOUS

## 2015-04-14 MED ORDER — CEFUROXIME OPHTHALMIC INJECTION 1 MG/0.1 ML
INJECTION | OPHTHALMIC | Status: DC | PRN
  Administered 2015-04-14: 0.1 mL via INTRACAMERAL

## 2015-04-14 MED ORDER — BSS PLUS IO SOLN
Freq: Once | INTRAOCULAR | Status: DC
  Filled 2015-04-14: qty 500

## 2015-04-14 MED ORDER — LIDOCAINE HCL (PF) 4 % IJ SOLN
INTRAMUSCULAR | Status: DC | PRN
  Administered 2015-04-14: 5 mL via OPHTHALMIC

## 2015-04-14 MED ORDER — CYCLOPENTOLATE HCL 1 % OP SOLN
1.0000 [drp] | OPHTHALMIC | Status: AC
  Administered 2015-04-14 (×3): 1 [drp] via OPHTHALMIC
  Filled 2015-04-14: qty 2

## 2015-04-14 MED ORDER — DEXTROSE 50 % IV SOLN
INTRAVENOUS | Status: AC
  Filled 2015-04-14: qty 50

## 2015-04-14 MED ORDER — DEXAMETHASONE SODIUM PHOSPHATE 10 MG/ML IJ SOLN
INTRAMUSCULAR | Status: AC
  Filled 2015-04-14: qty 1

## 2015-04-14 MED ORDER — PHENYLEPHRINE HCL 10 % OP SOLN
1.0000 [drp] | OPHTHALMIC | Status: AC
  Administered 2015-04-14 (×3): 1 [drp] via OPHTHALMIC

## 2015-04-14 MED ORDER — PHENYLEPHRINE HCL 10 % OP SOLN
OPHTHALMIC | Status: AC
  Administered 2015-04-14: 1 [drp] via OPHTHALMIC
  Filled 2015-04-14: qty 5

## 2015-04-14 MED ORDER — ATROPINE SULFATE 1 % OP SOLN
OPHTHALMIC | Status: AC
  Filled 2015-04-14: qty 5

## 2015-04-14 MED ORDER — TETRACAINE HCL 0.5 % OP SOLN
OPHTHALMIC | Status: AC
  Filled 2015-04-14: qty 2

## 2015-04-14 MED ORDER — LIDOCAINE HCL (PF) 4 % IJ SOLN
INTRAMUSCULAR | Status: AC
  Filled 2015-04-14: qty 5

## 2015-04-14 MED ORDER — DEXAMETHASONE SODIUM PHOSPHATE 10 MG/ML IJ SOLN
INTRAMUSCULAR | Status: DC | PRN
  Administered 2015-04-14: 10 mg

## 2015-04-14 MED ORDER — NEOMYCIN-POLYMYXIN-DEXAMETH 3.5-10000-0.1 OP OINT
TOPICAL_OINTMENT | OPHTHALMIC | Status: DC | PRN
  Administered 2015-04-14: 1 via OPHTHALMIC

## 2015-04-14 MED ORDER — HYPROMELLOSE 0.3 % OP GEL
OPHTHALMIC | Status: AC
  Filled 2015-04-14: qty 3.5

## 2015-04-14 MED ORDER — ATROPINE SULFATE 1 % OP SOLN
OPHTHALMIC | Status: DC | PRN
  Administered 2015-04-14: 1 [drp] via OPHTHALMIC

## 2015-04-14 MED ORDER — HYALURONIDASE HUMAN 150 UNIT/ML IJ SOLN
INTRAMUSCULAR | Status: AC
  Filled 2015-04-14: qty 1

## 2015-04-14 MED ORDER — NEOMYCIN-POLYMYXIN-DEXAMETH 3.5-10000-0.1 OP OINT
TOPICAL_OINTMENT | OPHTHALMIC | Status: AC
  Filled 2015-04-14: qty 3.5

## 2015-04-14 MED ORDER — BUPIVACAINE HCL (PF) 0.75 % IJ SOLN
INTRAMUSCULAR | Status: AC
  Filled 2015-04-14: qty 10

## 2015-04-14 MED ORDER — TRIAMCINOLONE ACETONIDE 40 MG/ML IJ SUSP
INTRAMUSCULAR | Status: AC
  Filled 2015-04-14: qty 1

## 2015-04-14 SURGICAL SUPPLY — 33 items
BIOM OPTIC SET ×6 IMPLANT
BLADE EAR TYMPAN 2.5 STR BEAV (BLADE) ×3 IMPLANT
CANNULA SOFT TIP 25G (CANNULA) ×3 IMPLANT
CORD BIP STRL DISP 12FT (MISCELLANEOUS) ×3 IMPLANT
CUP MEDICINE 2OZ PLAST GRAD ST (MISCELLANEOUS) ×3 IMPLANT
DRAPE XRAY CASSETTE 23X24 (DRAPES) IMPLANT
ERASER HMR WETFIELD 25G (MISCELLANEOUS) ×3 IMPLANT
EXTENSION SET 2C8607 ×3 IMPLANT
FORCEPS GRIESH GRASP 25G (INSTRUMENTS) IMPLANT
FORCEPS GRIESH ILM PLUS 25G (INSTRUMENTS) IMPLANT
GLOVE BIO SURGEON STRL SZ8 (GLOVE) ×3 IMPLANT
GLOVE SURG LX 6.5 MICRO (GLOVE) ×2
GLOVE SURG LX STRL 6.5 MICRO (GLOVE) ×1 IMPLANT
GOWN STRL REUS W/ TWL LRG LVL3 (GOWN DISPOSABLE) ×2 IMPLANT
GOWN STRL REUS W/TWL LRG LVL3 (GOWN DISPOSABLE) ×4
LENS VITRECTOMY FLAT DISP (MISCELLANEOUS) ×3 IMPLANT
MILEX FILTER ×2 IMPLANT
NDL RETROBULBAR .5 NSTRL (NEEDLE) ×3 IMPLANT
NDL SAFETY 25GX1.5 (NEEDLE) IMPLANT
PACK EYE AFTER SURG (MISCELLANEOUS) ×3 IMPLANT
PACK VITRECTOMY (MISCELLANEOUS) ×3 IMPLANT
PACK VITRECTOMY CASSETTE 25GA (MISCELLANEOUS) ×3 IMPLANT
PROBE DIRECTIONAL LASER (MISCELLANEOUS) ×3 IMPLANT
SOL ANTI-FOG 6CC FOG-OUT (MISCELLANEOUS) ×1 IMPLANT
SOL FOG-OUT ANTI-FOG 6CC (MISCELLANEOUS) ×2
SOL PREP PVP 2OZ (MISCELLANEOUS) ×3
SOLUTION PREP PVP 2OZ (MISCELLANEOUS) ×1 IMPLANT
STRAP SAFETY BODY (MISCELLANEOUS) ×3 IMPLANT
SUT VICRYL 7 0 TG140 8 (SUTURE) ×3 IMPLANT
SYR 3ML LL SCALE MARK (SYRINGE) ×6 IMPLANT
SYR 50ML LL SCALE MARK (SYRINGE) ×3 IMPLANT
SYR TB 1ML LUER SLIP (SYRINGE) ×2 IMPLANT
SYRINGE 10CC LL (SYRINGE) ×3 IMPLANT

## 2015-04-14 NOTE — Op Note (Signed)
   PREOPERATIVE DIAGNOSIS: Macula off retinal detachment left eye.             POST OPERATIVE DIAGNOSIS: Macula off retinal detachmen, left eye.                       OPERATION PERFORMED: 25-gauge pars plana vitrectomy with drain retinotomy, air fluid exchange, corneal scraping, endolaser and 24% SF6, left eye           ANESTHESIA: Retrobulbar block COMPLICATIONS: None.     BLOOD LOSS: Minimal.   SPECIMEN: None.    INDICATIONS & JUSTIFICATIONS FOR SURGERY: Patient was evalutated in the clinic for a macula off retinal detachment in the left eye. Risks, benefits, alternative and complications were discussed with patient and she elected to proceed with pars plana vitrectomy with retinal detachment repair in the left eye.   DESCRIPTION OF PROCEDURE: On the day of surgery, the patient was greeted in the preoperative holding area.   She admitted to ingesting a piece of chocolate 4 hours prior to start time, so anesthesia refused MAC. All patient questions were answered and the left eye was marked.  The patient was then taken into the operating room in the supine position.    Three ml of a retrobulbar block consisting of 4% xylocaine, 0.75%sensorcaine and 100 units hylenex was injected.  The left eye was then prepped and draped in the usual sterile fashion.  Three 25-gauge trocars were placed in the usual position. The infusion cannula was checked to make sure it was in the vitreous cavity prior to starting the infusion.  A core vitrectomy was then performed. That patient did indeed have a posterior hyaloid detachment, so the vitreous was then trimmed to the periphery nasally, in the area of attached retina.  The vitreous was then carefully trimmed from over the detached retina.  Scleral depressed exam allowed for more vitreous removal over the area of detached retina.  The vitreous was removed 360 degrees around the break at 2 and a fluid- fluid exchange was performed. 360 degree scleral depressed exam  revealed two small breaks adjacent to the causative break.  No other retinal breaks were identified. These break were then marked with cautery. An air-fluid exchange was performed and the retinal and subretinal spaces were both drained of fluid.  Of note the subretinal fluid was very thick, suggesting chronicity, and difficult to drain from the peripheral break.  A drain retinotomy was then created in the meridian of the original break and the subretinal fluid was drained further through it.  At this point visualization became problematic, and after other solutions were attempted it was necessary to scrape the corneal epithelium.  A beaver blade was used to scrape the cornea.  Then the drain retinotomy was again used to drain the remainder of subretinal fluid and both the drain retinotomy as well as all the other retinal holes were lasered.  One trocar was then removed and sutured with 7.0 vicryl. 24% SF6 was the infused 4 times the vitreous volume through the remaining trocars.  The other trocars were then removed and sutured.  Intraocular pressure was acceptable by palpation.  Conjunctiva was closed with the 7.0 vicryl. Subconjunctival cefuroxime and dexamethasone was then injected and the eye was patched and shielded with neo-poly-dex ointment and atropine 1%. Patient has follow up tomorrow at the [facility] office.

## 2015-04-14 NOTE — OR Nursing (Signed)
Medications: genteal applied to left eye throughout the procedure

## 2015-04-14 NOTE — H&P (Signed)
.  Previous H&P scanned in reviewed, patient examined, and no interval changes.  Please see scanned record for complete information.   

## 2015-04-14 NOTE — Progress Notes (Signed)
Pt. In post op discharge , lying on left side for one hour then on right for 50 minutes out of every hour

## 2015-04-14 NOTE — Anesthesia Preprocedure Evaluation (Deleted)
Anesthesia Evaluation  Patient identified by MRN, date of birth, ID band Patient awake    Reviewed: Allergy & Precautions, NPO status , Patient's Chart, lab work & pertinent test results  Airway Mallampati: II  TM Distance: >3 FB Neck ROM: Full    Dental no notable dental hx.    Pulmonary shortness of breath, sleep apnea and Continuous Positive Airway Pressure Ventilation ,  breath sounds clear to auscultation  Pulmonary exam normal       Cardiovascular hypertension, Pt. on medications Normal cardiovascular exam+ Valvular Problems/Murmurs MVP Rhythm:Regular Rate:Normal     Neuro/Psych PSYCHIATRIC DISORDERS Depression negative neurological ROS     GI/Hepatic Neg liver ROS, GERD-  Medicated,  Endo/Other  Hypothyroidism   Renal/GU negative Renal ROS  negative genitourinary   Musculoskeletal negative musculoskeletal ROS (+)   Abdominal   Peds negative pediatric ROS (+)  Hematology negative hematology ROS (+)   Anesthesia Other Findings   Reproductive/Obstetrics negative OB ROS                             Anesthesia Physical Anesthesia Plan  ASA: III  Anesthesia Plan: MAC   Post-op Pain Management:    Induction: Intravenous  Airway Management Planned: Nasal Cannula  Additional Equipment:   Intra-op Plan:   Post-operative Plan:   Informed Consent: I have reviewed the patients History and Physical, chart, labs and discussed the procedure including the risks, benefits and alternatives for the proposed anesthesia with the patient or authorized representative who has indicated his/her understanding and acceptance.     Plan Discussed with: CRNA and Surgeon  Anesthesia Plan Comments:         Anesthesia Quick Evaluation

## 2015-04-14 NOTE — Discharge Instructions (Addendum)
Vitrectomy Care After Refer to this sheet in the next few weeks. These instructions provide you with information on caring for yourself after your procedure. Your caregiver may also give you more specific instructions. Your treatment has been planned according to current medical practices, but problems sometimes occur. Call your caregiver if you have any problems or questions after your procedure. HOME CARE INSTRUCTIONS  If you are instructed to stay in a certain position for a certain amount of time, it is important to do so. Positions include sitting up or lying on your back. The reasons for certain positioning depends on the reason you had the surgery and the type of vitrectomy performed. Ask your caregiver if you have to remain in a certain position for a certain amount of time.   Wear your eye patch for the first day, or as directed by your caregiver.  Keep the area around your eye clean and dry.  Put any eyedrops in your eyes as directed by your caregiver.  Wear the plastic shield over your eye when you sleep if you are instructed to do so. This is to protect the eye from being accidentally bumped or jabbed, or having too much pressure put on it.  Avoid any strenuous physical activity for as long as directed by your caregiver. This includes bending over, lifting anything over 5 pounds (2.3 kg), or straining. Talk to your caregiver about when it may be safe to resume sexual activity.  If any of your regular medicines were stopped before surgery, carefully follow your caregiver's instructions about which medicines to restart and when to restart them. You may be instructed not to use blood thinners or aspirin.  Continue with your normal diet unless directed otherwise by your caregiver. If you are diabetic, stay on your diabetic diet, or use your insulin as directed by your caregiver.  It is okay to use your other eye for reading and watching television.  Do not drive a car or use contact  lenses until your caregiver says it is okay.  SEEK MEDICAL CARE IF:  Your eye becomes very red or painful.  You develop any pus or discharge from the eye.  You have chills.  Your eyelids on either eye become swollen or stuck shut. SEEK IMMEDIATE MEDICAL CARE IF:  You have a fever.  You notice a change in vision in either eye.  You see more floaters or spots in front of your vision.  Part of your side vision is black and you cannot see through it.It may feel like a shade is being pulled toward the center of your vision from any direction. Document Released: 08/08/2011 Document Revised: 02/12/2012 Document Reviewed: 08/08/2011 [facility] Patient Information 2015 [place], [place]. This information is not intended to replace advice given to you by your health care provider. Make sure you discuss any questions you have with your health care provider.

## 2015-04-14 NOTE — OR Nursing (Addendum)
SF6 gas injected into left eye 24%

## 2015-04-14 NOTE — OR Nursing (Signed)
Crystal Arnold monitoring patient vital signs during procedure because anesthesia is not involved in case due to patient eating chocolate at 0300 morning of procedure

## 2015-04-18 ENCOUNTER — Encounter: Payer: Self-pay | Admitting: Ophthalmology

## 2015-05-14 DIAGNOSIS — E785 Hyperlipidemia, unspecified: Secondary | ICD-10-CM | POA: Diagnosis not present

## 2015-05-14 DIAGNOSIS — M810 Age-related osteoporosis without current pathological fracture: Secondary | ICD-10-CM | POA: Diagnosis not present

## 2015-05-14 DIAGNOSIS — I10 Essential (primary) hypertension: Secondary | ICD-10-CM | POA: Diagnosis not present

## 2015-05-14 DIAGNOSIS — K210 Gastro-esophageal reflux disease with esophagitis: Secondary | ICD-10-CM | POA: Diagnosis not present

## 2015-05-14 DIAGNOSIS — F329 Major depressive disorder, single episode, unspecified: Secondary | ICD-10-CM | POA: Diagnosis not present

## 2015-05-14 DIAGNOSIS — M545 Low back pain: Secondary | ICD-10-CM | POA: Diagnosis not present

## 2015-05-14 DIAGNOSIS — J302 Other seasonal allergic rhinitis: Secondary | ICD-10-CM | POA: Diagnosis not present

## 2015-05-14 DIAGNOSIS — F0390 Unspecified dementia without behavioral disturbance: Secondary | ICD-10-CM | POA: Diagnosis not present

## 2015-05-14 DIAGNOSIS — F419 Anxiety disorder, unspecified: Secondary | ICD-10-CM | POA: Diagnosis not present

## 2015-05-14 DIAGNOSIS — E038 Other specified hypothyroidism: Secondary | ICD-10-CM | POA: Diagnosis not present

## 2015-06-04 ENCOUNTER — Ambulatory Visit
Admission: EM | Admit: 2015-06-04 | Discharge: 2015-06-04 | Disposition: A | Discharge status: Home / Self Care | Payer: Medicare Other | Attending: Family Medicine | Admitting: Family Medicine

## 2015-06-04 ENCOUNTER — Encounter: Payer: Self-pay | Admitting: Gynecology

## 2015-06-04 DIAGNOSIS — J069 Acute upper respiratory infection, unspecified: Secondary | ICD-10-CM | POA: Diagnosis not present

## 2015-06-04 MED ORDER — DOXYCYCLINE HYCLATE 100 MG PO CAPS: 100.0000 mg | ORAL_CAPSULE | Freq: Two times a day (BID) | ORAL | Status: AC

## 2015-06-04 MED ORDER — ALBUTEROL SULFATE HFA 108 (90 BASE) MCG/ACT IN AERS: 1.0000 | INHALATION_SPRAY | Freq: Four times a day (QID) | RESPIRATORY_TRACT | Status: AC | PRN

## 2015-06-04 MED ORDER — HYDROCOD POLST-CPM POLST ER 10-8 MG/5ML PO SUER: ORAL | Status: DC

## 2015-06-04 NOTE — ED Provider Notes (Signed)
Patient presents today with symptoms of cough, nasal congestion, sneezing, wheezing for the last 3 days. Patient states that she does have a history of COPD from secondhand smoke. She does not use any inhalers. She denies any history of any cardiac problems sides a mitral valve disorder. She has been taking Benadryl for her symptoms. She has tried Mucinex 1. She denies any shortness of breath, chest pain, fever, chills, nausea, vomiting, diaphoresis, severe headache.  ROS: Negative except mentioned above.   GENERAL: NAD HEENT: no pharyngeal erythema, no exudate, no erythema of TMs, no cervical LAD RESP: CTA B CARD: RRR NEURO: CN II-XII grossly intact   A/P: URI w/hx of COPD due to secondhand smoke-patient given prescription for doxycycline, albuterol inhaler when necessary, Claritin when necessary, Tussionex when necessary at bedtime. Encourage patient to seek medical attention if symptoms do persist or worsen.     Paulina Fusi, MD 06/04/15 [phone]

## 2015-06-04 NOTE — ED Notes (Signed)
Patient c/o cough / wheezing and chest congestion x3 days.

## 2015-07-26 DIAGNOSIS — H33012 Retinal detachment with single break, left eye: Secondary | ICD-10-CM | POA: Diagnosis not present

## 2015-08-11 DIAGNOSIS — H4011X1 Primary open-angle glaucoma, mild stage: Secondary | ICD-10-CM | POA: Diagnosis not present

## 2015-08-16 DIAGNOSIS — F0390 Unspecified dementia without behavioral disturbance: Secondary | ICD-10-CM | POA: Diagnosis not present

## 2015-08-16 DIAGNOSIS — I10 Essential (primary) hypertension: Secondary | ICD-10-CM | POA: Diagnosis not present

## 2015-08-16 DIAGNOSIS — Z6825 Body mass index (BMI) 25.0-25.9, adult: Secondary | ICD-10-CM | POA: Diagnosis not present

## 2015-08-16 DIAGNOSIS — J302 Other seasonal allergic rhinitis: Secondary | ICD-10-CM | POA: Diagnosis not present

## 2015-08-16 DIAGNOSIS — F419 Anxiety disorder, unspecified: Secondary | ICD-10-CM | POA: Diagnosis not present

## 2015-08-16 DIAGNOSIS — M810 Age-related osteoporosis without current pathological fracture: Secondary | ICD-10-CM | POA: Diagnosis not present

## 2015-08-16 DIAGNOSIS — F329 Major depressive disorder, single episode, unspecified: Secondary | ICD-10-CM | POA: Diagnosis not present

## 2015-08-16 DIAGNOSIS — K210 Gastro-esophageal reflux disease with esophagitis: Secondary | ICD-10-CM | POA: Diagnosis not present

## 2015-08-16 DIAGNOSIS — R739 Hyperglycemia, unspecified: Secondary | ICD-10-CM | POA: Diagnosis not present

## 2015-08-16 DIAGNOSIS — E038 Other specified hypothyroidism: Secondary | ICD-10-CM | POA: Diagnosis not present

## 2015-08-16 DIAGNOSIS — M545 Low back pain: Secondary | ICD-10-CM | POA: Diagnosis not present

## 2015-08-16 DIAGNOSIS — E785 Hyperlipidemia, unspecified: Secondary | ICD-10-CM | POA: Diagnosis not present

## 2015-08-16 DIAGNOSIS — E559 Vitamin D deficiency, unspecified: Secondary | ICD-10-CM | POA: Diagnosis not present

## 2015-08-17 DIAGNOSIS — H2513 Age-related nuclear cataract, bilateral: Secondary | ICD-10-CM | POA: Diagnosis not present

## 2015-08-19 DIAGNOSIS — H2513 Age-related nuclear cataract, bilateral: Secondary | ICD-10-CM | POA: Diagnosis not present

## 2015-08-20 ENCOUNTER — Encounter: Payer: Self-pay | Admitting: *Deleted

## 2015-08-24 NOTE — Discharge Instructions (Signed)

## 2015-08-25 ENCOUNTER — Ambulatory Visit
Admission: RE | Admit: 2015-08-25 | Discharge: 2015-08-25 | Disposition: A | Discharge status: Home / Self Care | Payer: Medicare Other | Source: Ambulatory Visit | Attending: Ophthalmology | Admitting: Ophthalmology

## 2015-08-25 ENCOUNTER — Ambulatory Visit: Payer: Medicare Other | Admitting: Student in an Organized Health Care Education/Training Program

## 2015-08-25 ENCOUNTER — Encounter
Admission: RE | Disposition: A | Discharge status: Home / Self Care | Payer: Self-pay | Source: Ambulatory Visit | Attending: Ophthalmology

## 2015-08-25 DIAGNOSIS — I341 Nonrheumatic mitral (valve) prolapse: Secondary | ICD-10-CM | POA: Diagnosis not present

## 2015-08-25 DIAGNOSIS — Z882 Allergy status to sulfonamides status: Secondary | ICD-10-CM | POA: Diagnosis not present

## 2015-08-25 DIAGNOSIS — I10 Essential (primary) hypertension: Secondary | ICD-10-CM | POA: Diagnosis not present

## 2015-08-25 DIAGNOSIS — H2512 Age-related nuclear cataract, left eye: Secondary | ICD-10-CM | POA: Insufficient documentation

## 2015-08-25 DIAGNOSIS — R011 Cardiac murmur, unspecified: Secondary | ICD-10-CM | POA: Diagnosis not present

## 2015-08-25 DIAGNOSIS — Z91013 Allergy to seafood: Secondary | ICD-10-CM | POA: Diagnosis not present

## 2015-08-25 DIAGNOSIS — Z7982 Long term (current) use of aspirin: Secondary | ICD-10-CM | POA: Diagnosis not present

## 2015-08-25 DIAGNOSIS — K219 Gastro-esophageal reflux disease without esophagitis: Secondary | ICD-10-CM | POA: Insufficient documentation

## 2015-08-25 DIAGNOSIS — J449 Chronic obstructive pulmonary disease, unspecified: Secondary | ICD-10-CM | POA: Insufficient documentation

## 2015-08-25 DIAGNOSIS — G4730 Sleep apnea, unspecified: Secondary | ICD-10-CM | POA: Diagnosis not present

## 2015-08-25 DIAGNOSIS — H21542 Posterior synechiae (iris), left eye: Secondary | ICD-10-CM | POA: Diagnosis not present

## 2015-08-25 DIAGNOSIS — H2513 Age-related nuclear cataract, bilateral: Secondary | ICD-10-CM | POA: Diagnosis not present

## 2015-08-25 DIAGNOSIS — E039 Hypothyroidism, unspecified: Secondary | ICD-10-CM | POA: Diagnosis not present

## 2015-08-25 HISTORY — DX: Post-traumatic stress disorder, unspecified: F43.10

## 2015-08-25 HISTORY — DX: Chronic obstructive pulmonary disease, unspecified: J44.9

## 2015-08-25 HISTORY — DX: Pure hypercholesterolemia, unspecified: E78.00

## 2015-08-25 HISTORY — DX: Other symptoms and signs involving the musculoskeletal system: R29.898

## 2015-08-25 HISTORY — DX: Motion sickness, initial encounter: T75.3XXA

## 2015-08-25 HISTORY — PX: CATARACT EXTRACTION W/PHACO: SHX586

## 2015-08-25 HISTORY — DX: Age-related osteoporosis without current pathological fracture: M81.0

## 2015-08-25 HISTORY — DX: Other allergic rhinitis: J30.89

## 2015-08-25 SURGERY — PHACOEMULSIFICATION, CATARACT, WITH IOL INSERTION
Anesthesia: Monitor Anesthesia Care | Laterality: Left | Wound class: Clean

## 2015-08-25 MED ORDER — OXYCODONE HCL 5 MG PO TABS: 5.0000 mg | ORAL_TABLET | Freq: Once | ORAL | Status: DC | PRN

## 2015-08-25 MED ORDER — ACETAMINOPHEN 325 MG PO TABS: 325.0000 mg | ORAL_TABLET | ORAL | Status: DC | PRN

## 2015-08-25 MED ORDER — BRIMONIDINE TARTRATE 0.2 % OP SOLN
OPHTHALMIC | Status: DC | PRN
  Administered 2015-08-25: 1 [drp] via OPHTHALMIC

## 2015-08-25 MED ORDER — PROPARACAINE HCL 0.5 % OP SOLN
1.0000 [drp] | Freq: Once | OPHTHALMIC | Status: AC
  Administered 2015-08-25: 1 [drp] via OPHTHALMIC

## 2015-08-25 MED ORDER — NA HYALUR & NA CHOND-NA HYALUR 0.4-0.35 ML IO KIT
PACK | INTRAOCULAR | Status: DC | PRN
  Administered 2015-08-25: 1 mL via INTRAOCULAR

## 2015-08-25 MED ORDER — ERYTHROMYCIN 5 MG/GM OP OINT
TOPICAL_OINTMENT | OPHTHALMIC | Status: DC | PRN
  Administered 2015-08-25: 1 via OPHTHALMIC

## 2015-08-25 MED ORDER — ACETAMINOPHEN 160 MG/5ML PO SOLN: 325.0000 mg | ORAL | Status: DC | PRN

## 2015-08-25 MED ORDER — LIDOCAINE HCL (PF) 4 % IJ SOLN
INTRAMUSCULAR | Status: DC | PRN
  Administered 2015-08-25: 3 mL via OPHTHALMIC

## 2015-08-25 MED ORDER — EPINEPHRINE HCL 1 MG/ML IJ SOLN
INTRAOCULAR | Status: DC | PRN
  Administered 2015-08-25: 70 mL via OPHTHALMIC

## 2015-08-25 MED ORDER — LACTATED RINGERS IV SOLN: 500.0000 mL | INTRAVENOUS | Status: DC

## 2015-08-25 MED ORDER — ALFENTANIL 500 MCG/ML IJ INJ
INJECTION | INTRAMUSCULAR | Status: DC | PRN
  Administered 2015-08-25: 500 ug via INTRAVENOUS

## 2015-08-25 MED ORDER — FENTANYL CITRATE (PF) 100 MCG/2ML IJ SOLN: 25.0000 ug | INTRAMUSCULAR | Status: DC | PRN

## 2015-08-25 MED ORDER — MIDAZOLAM HCL 2 MG/2ML IJ SOLN
INTRAMUSCULAR | Status: DC | PRN
  Administered 2015-08-25: 1 mg via INTRAVENOUS

## 2015-08-25 MED ORDER — DEXAMETHASONE SODIUM PHOSPHATE 4 MG/ML IJ SOLN: 8.0000 mg | Freq: Once | INTRAMUSCULAR | Status: DC | PRN

## 2015-08-25 MED ORDER — POVIDONE-IODINE 5 % OP SOLN
1.0000 "application " | OPHTHALMIC | Status: DC | PRN
  Administered 2015-08-25: 1 via OPHTHALMIC

## 2015-08-25 MED ORDER — OXYCODONE HCL 5 MG/5ML PO SOLN: 5.0000 mg | Freq: Once | ORAL | Status: DC | PRN

## 2015-08-25 MED ORDER — TIMOLOL MALEATE 0.5 % OP SOLN
OPHTHALMIC | Status: DC | PRN
  Administered 2015-08-25: 1 [drp] via OPHTHALMIC

## 2015-08-25 MED ORDER — LACTATED RINGERS IV SOLN: INTRAVENOUS | Status: DC

## 2015-08-25 MED ORDER — ARMC OPHTHALMIC DILATING GEL
1.0000 "application " | OPHTHALMIC | Status: DC | PRN
  Administered 2015-08-25 (×2): 1 via OPHTHALMIC

## 2015-08-25 MED ORDER — CEFUROXIME OPHTHALMIC INJECTION 1 MG/0.1 ML
INJECTION | OPHTHALMIC | Status: DC | PRN
  Administered 2015-08-25: 0.1 mL via INTRACAMERAL

## 2015-08-25 SURGICAL SUPPLY — 26 items
CANNULA ANT/CHMB 27GA (MISCELLANEOUS) ×3 IMPLANT
GLOVE SURG LX 7.5 STRW (GLOVE) ×2
GLOVE SURG LX STRL 7.5 STRW (GLOVE) ×1 IMPLANT
GLOVE SURG TRIUMPH 8.0 PF LTX (GLOVE) ×3 IMPLANT
GOWN STRL REUS W/ TWL LRG LVL3 (GOWN DISPOSABLE) ×2 IMPLANT
GOWN STRL REUS W/TWL LRG LVL3 (GOWN DISPOSABLE) ×4
LENS IOL TECNIS 23.0 (Intraocular Lens) ×3 IMPLANT
LENS IOL TECNIS MONO 1P 23.0 (Intraocular Lens) ×1 IMPLANT
MARKER SKIN SURG W/RULER VIO (MISCELLANEOUS) ×3 IMPLANT
NDL RETROBULBAR .5 NSTRL (NEEDLE) IMPLANT
NEEDLE FILTER BLUNT 18X 1/2SAF (NEEDLE) ×2
NEEDLE FILTER BLUNT 18X1 1/2 (NEEDLE) ×1 IMPLANT
PACK CATARACT BRASINGTON (MISCELLANEOUS) ×3 IMPLANT
PACK EYE AFTER SURG (MISCELLANEOUS) ×3 IMPLANT
PACK OPTHALMIC (MISCELLANEOUS) ×3 IMPLANT
RING MALYGIN 7.0 (MISCELLANEOUS) IMPLANT
SUT ETHILON 10-0 CS-B-6CS-B-6 (SUTURE)
SUT VICRYL  9 0 (SUTURE)
SUT VICRYL 9 0 (SUTURE) IMPLANT
SUTURE EHLN 10-0 CS-B-6CS-B-6 (SUTURE) IMPLANT
SYR 3ML LL SCALE MARK (SYRINGE) ×3 IMPLANT
SYR 5ML LL (SYRINGE) IMPLANT
SYR TB 1ML LUER SLIP (SYRINGE) ×3 IMPLANT
WATER STERILE IRR 250ML POUR (IV SOLUTION) ×3 IMPLANT
WATER STERILE IRR 500ML POUR (IV SOLUTION) IMPLANT
WIPE NON LINTING 3.25X3.25 (MISCELLANEOUS) ×3 IMPLANT

## 2015-08-25 NOTE — Anesthesia Postprocedure Evaluation (Signed)
  Anesthesia Post-op Note  Patient: Crystal Arnold  Procedure(s) Performed: Procedure(s) with comments: CATARACT EXTRACTION PHACO AND INTRAOCULAR LENS PLACEMENT (IOC) WITH IOL COMPLICATED (Left) - IVA BLOCK CPAP  Anesthesia type:MAC  Patient location: PACU  Post pain: Pain level controlled  Post assessment: Post-op Vital signs reviewed, Patient's Cardiovascular Status Stable, Respiratory Function Stable, Patent Airway and No signs of Nausea or vomiting  Post vital signs: Reviewed and stable  Last Vitals:  Filed Vitals:   08/25/15 0848  BP: 154/71  Pulse: 54  Temp:   Resp: 15    Level of consciousness: awake, alert  and patient cooperative  Complications: No apparent anesthesia complications

## 2015-08-25 NOTE — Anesthesia Procedure Notes (Signed)
Procedure Name: MAC Performed by: Dalya Maselli Pre-anesthesia Checklist: Patient identified, Emergency Drugs available, Suction available, Patient being monitored and Timeout performed Patient Re-evaluated:Patient Re-evaluated prior to inductionOxygen Delivery Method: Nasal cannula       

## 2015-08-25 NOTE — Op Note (Signed)
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 Item Serial No. Manufacturer Lot No. LRB No. Used  LENS IMPL INTRAOC ZCB00 23.0 - [id number] Intraocular Lens LENS IMPL INTRAOC ZCB00 23.0 [id number] AMO   Left 1       ULTRASOUND TIME: 19 %  of 1 minutes 44 seconds, CDE 20.0  SURGEON:  Wyonia Hough, MD   ANESTHESIA:  Retrobulbar block of Xylocaine and Bupivacaine   COMPLICATIONS:  None.   DESCRIPTION OF PROCEDURE:  The patient was identified in the holding room and transported to the operating suite and placed in the supine position. The left eye was identified as the operative eye, and a retrobulbar block was administered under inravenous sedation.  It was then prepped and draped in the usual sterile ophthalmic fashion.    A 1 millimeter clear-corneal paracentesis was made at the 1:30 position.  The anterior chamber was filled with Viscoat viscoelastic.  A 2.4 millimeter keratome was used to make a near-clear corneal incision at the 10:30 position. The eye was inspected and the pupil was noted to be miotic with a diameter of 4-5 millimeters A Viscoat canula was used to break adhesions between the iris and anterior capsule. A Kuglen hook was placed through the incision and the iris was stretched.  This was repeated several times.  Additional viscoelastic was placed in the eye and the pupil was noted to be dilated to approximately 6-7 millimeters.  It remained dilated adequately throughout the remainder of the phacoemulsification procedure.   Phacoemulsification was then used in stop and chop fashion to remove the lens nucleus and epinucleus.  The remaining cortex was then removed using the irrigation and aspiration handpiece. Provisc was then placed into the capsular bag to distend it for lens placement.  A lens was then injected into the capsular bag.  The remaining viscoelastic was aspirated.   Wounds were hydrated with balanced salt solution.  The anterior chamber was inflated to a physiologic pressure with balanced salt solution.  No wound leaks were noted.Cefuroxime 0.1 ml of a 10mg /ml solution was injected into the anterior chamber for a dose of 1 mg of intracameral antibiotic at the completion of the case.  Timolol and Brimonidine drops were placed followed by erythromycin ointment.  The eye was patched.  The patient was taken to the recovery room in stable condition without complications of anesthesia or surgery.     Shannan Garfinkel 08/25/2015, 8:42 AM

## 2015-08-25 NOTE — Transfer of Care (Signed)
Immediate Anesthesia Transfer of Care Note  Patient: Crystal Arnold  Procedure(s) Performed: Procedure(s) with comments: CATARACT EXTRACTION PHACO AND INTRAOCULAR LENS PLACEMENT (IOC) WITH IOL COMPLICATED (Left) - IVA BLOCK CPAP  Patient Location: PACU  Anesthesia Type: MAC  Level of Consciousness: awake, alert  and patient cooperative  Airway and Oxygen Therapy: Patient Spontanous Breathing and Patient connected to supplemental oxygen  Post-op Assessment: Post-op Vital signs reviewed, Patient's Cardiovascular Status Stable, Respiratory Function Stable, Patent Airway and No signs of Nausea or vomiting  Post-op Vital Signs: Reviewed and stable  Complications: No apparent anesthesia complications

## 2015-08-25 NOTE — H&P (Signed)
  The History and Physical notes were scanned in.  The patient remains stable and unchanged from the H&P.   Previous H&P reviewed, patient examined, and there are no changes.  [name],[name] 08/25/2015 7:33 AM

## 2015-08-25 NOTE — Anesthesia Preprocedure Evaluation (Signed)
Anesthesia Evaluation  Patient identified by MRN, date of birth, ID band Patient awake    Reviewed: Allergy & Precautions, H&P , NPO status , Patient's Chart, lab work & pertinent test results, reviewed documented beta blocker date and time   Airway Mallampati: II  TM Distance: >3 FB Neck ROM: full    Dental no notable dental hx.    Pulmonary shortness of breath, sleep apnea and Continuous Positive Airway Pressure Ventilation , COPD,    Pulmonary exam normal breath sounds clear to auscultation       Cardiovascular Exercise Tolerance: Good hypertension, + Valvular Problems/Murmurs MVP  Rhythm:regular Rate:Normal     Neuro/Psych PSYCHIATRIC DISORDERS negative neurological ROS     GI/Hepatic Neg liver ROS, GERD  Medicated,  Endo/Other  Hypothyroidism   Renal/GU negative Renal ROS  negative genitourinary   Musculoskeletal   Abdominal   Peds  Hematology negative hematology ROS (+)   Anesthesia Other Findings   Reproductive/Obstetrics negative OB ROS                             Anesthesia Physical Anesthesia Plan  ASA: III  Anesthesia Plan: MAC   Post-op Pain Management:    Induction:   Airway Management Planned:   Additional Equipment:   Intra-op Plan:   Post-operative Plan:   Informed Consent: I have reviewed the patients History and Physical, chart, labs and discussed the procedure including the risks, benefits and alternatives for the proposed anesthesia with the patient or authorized representative who has indicated his/her understanding and acceptance.     Plan Discussed with: CRNA  Anesthesia Plan Comments:         Anesthesia Quick Evaluation

## 2015-08-26 ENCOUNTER — Encounter: Payer: Self-pay | Admitting: Ophthalmology

## 2015-10-04 DIAGNOSIS — H35352 Cystoid macular degeneration, left eye: Secondary | ICD-10-CM | POA: Diagnosis not present

## 2015-11-01 DIAGNOSIS — H35352 Cystoid macular degeneration, left eye: Secondary | ICD-10-CM | POA: Diagnosis not present

## 2015-11-11 DIAGNOSIS — I10 Essential (primary) hypertension: Secondary | ICD-10-CM | POA: Diagnosis not present

## 2015-11-11 DIAGNOSIS — F0390 Unspecified dementia without behavioral disturbance: Secondary | ICD-10-CM | POA: Diagnosis not present

## 2015-11-11 DIAGNOSIS — E038 Other specified hypothyroidism: Secondary | ICD-10-CM | POA: Diagnosis not present

## 2015-11-11 DIAGNOSIS — E785 Hyperlipidemia, unspecified: Secondary | ICD-10-CM | POA: Diagnosis not present

## 2015-11-11 DIAGNOSIS — E559 Vitamin D deficiency, unspecified: Secondary | ICD-10-CM | POA: Diagnosis not present

## 2015-11-11 DIAGNOSIS — M545 Low back pain: Secondary | ICD-10-CM | POA: Diagnosis not present

## 2015-11-11 DIAGNOSIS — Z23 Encounter for immunization: Secondary | ICD-10-CM | POA: Diagnosis not present

## 2015-11-11 DIAGNOSIS — F419 Anxiety disorder, unspecified: Secondary | ICD-10-CM | POA: Diagnosis not present

## 2015-11-11 DIAGNOSIS — R739 Hyperglycemia, unspecified: Secondary | ICD-10-CM | POA: Diagnosis not present

## 2015-11-11 DIAGNOSIS — K210 Gastro-esophageal reflux disease with esophagitis: Secondary | ICD-10-CM | POA: Diagnosis not present

## 2015-11-11 DIAGNOSIS — Z6825 Body mass index (BMI) 25.0-25.9, adult: Secondary | ICD-10-CM | POA: Diagnosis not present

## 2015-12-17 DIAGNOSIS — H35352 Cystoid macular degeneration, left eye: Secondary | ICD-10-CM | POA: Diagnosis not present

## 2016-01-04 DIAGNOSIS — H401131 Primary open-angle glaucoma, bilateral, mild stage: Secondary | ICD-10-CM | POA: Diagnosis not present

## 2016-01-11 DIAGNOSIS — H35372 Puckering of macula, left eye: Secondary | ICD-10-CM | POA: Diagnosis not present

## 2016-01-12 ENCOUNTER — Ambulatory Visit: Payer: Medicare Other | Admitting: *Deleted

## 2016-01-12 ENCOUNTER — Ambulatory Visit
Admission: RE | Admit: 2016-01-12 | Discharge: 2016-01-12 | Disposition: A | Discharge status: Home / Self Care | Payer: Medicare Other | Source: Ambulatory Visit | Attending: Ophthalmology | Admitting: Ophthalmology

## 2016-01-12 ENCOUNTER — Encounter: Payer: Self-pay | Admitting: *Deleted

## 2016-01-12 ENCOUNTER — Encounter
Admission: RE | Disposition: A | Discharge status: Home / Self Care | Payer: Self-pay | Source: Ambulatory Visit | Attending: Ophthalmology

## 2016-01-12 DIAGNOSIS — H43312 Vitreous membranes and strands, left eye: Secondary | ICD-10-CM | POA: Diagnosis not present

## 2016-01-12 DIAGNOSIS — I341 Nonrheumatic mitral (valve) prolapse: Secondary | ICD-10-CM | POA: Diagnosis not present

## 2016-01-12 DIAGNOSIS — R0602 Shortness of breath: Secondary | ICD-10-CM | POA: Diagnosis not present

## 2016-01-12 DIAGNOSIS — R251 Tremor, unspecified: Secondary | ICD-10-CM | POA: Diagnosis not present

## 2016-01-12 DIAGNOSIS — E119 Type 2 diabetes mellitus without complications: Secondary | ICD-10-CM | POA: Diagnosis not present

## 2016-01-12 DIAGNOSIS — N390 Urinary tract infection, site not specified: Secondary | ICD-10-CM | POA: Diagnosis not present

## 2016-01-12 DIAGNOSIS — H9193 Unspecified hearing loss, bilateral: Secondary | ICD-10-CM | POA: Insufficient documentation

## 2016-01-12 DIAGNOSIS — J449 Chronic obstructive pulmonary disease, unspecified: Secondary | ICD-10-CM | POA: Insufficient documentation

## 2016-01-12 DIAGNOSIS — K449 Diaphragmatic hernia without obstruction or gangrene: Secondary | ICD-10-CM | POA: Insufficient documentation

## 2016-01-12 DIAGNOSIS — H35372 Puckering of macula, left eye: Secondary | ICD-10-CM | POA: Diagnosis not present

## 2016-01-12 DIAGNOSIS — I10 Essential (primary) hypertension: Secondary | ICD-10-CM | POA: Insufficient documentation

## 2016-01-12 DIAGNOSIS — Z8711 Personal history of peptic ulcer disease: Secondary | ICD-10-CM | POA: Diagnosis not present

## 2016-01-12 DIAGNOSIS — F329 Major depressive disorder, single episode, unspecified: Secondary | ICD-10-CM | POA: Diagnosis not present

## 2016-01-12 DIAGNOSIS — G4730 Sleep apnea, unspecified: Secondary | ICD-10-CM | POA: Diagnosis not present

## 2016-01-12 DIAGNOSIS — Z90710 Acquired absence of both cervix and uterus: Secondary | ICD-10-CM | POA: Insufficient documentation

## 2016-01-12 DIAGNOSIS — Z882 Allergy status to sulfonamides status: Secondary | ICD-10-CM | POA: Diagnosis not present

## 2016-01-12 DIAGNOSIS — R011 Cardiac murmur, unspecified: Secondary | ICD-10-CM | POA: Insufficient documentation

## 2016-01-12 DIAGNOSIS — F4310 Post-traumatic stress disorder, unspecified: Secondary | ICD-10-CM | POA: Insufficient documentation

## 2016-01-12 DIAGNOSIS — K219 Gastro-esophageal reflux disease without esophagitis: Secondary | ICD-10-CM | POA: Diagnosis not present

## 2016-01-12 DIAGNOSIS — K589 Irritable bowel syndrome without diarrhea: Secondary | ICD-10-CM | POA: Diagnosis not present

## 2016-01-12 DIAGNOSIS — M810 Age-related osteoporosis without current pathological fracture: Secondary | ICD-10-CM | POA: Diagnosis not present

## 2016-01-12 HISTORY — DX: Tremor, unspecified: R25.1

## 2016-01-12 HISTORY — PX: MEMBRANE PEEL: SHX5967

## 2016-01-12 HISTORY — DX: Personal history of other diseases of the digestive system: Z87.19

## 2016-01-12 HISTORY — PX: PARS PLANA VITRECTOMY: SHX2166

## 2016-01-12 LAB — GLUCOSE, CAPILLARY
GLUCOSE-CAPILLARY: 117 mg/dL — AB (ref 65–99)
GLUCOSE-CAPILLARY: 141 mg/dL — AB (ref 65–99)

## 2016-01-12 SURGERY — PARS PLANA VITRECTOMY WITH 25 GAUGE
Anesthesia: Monitor Anesthesia Care | Laterality: Left

## 2016-01-12 MED ORDER — ATROPINE SULFATE 1 % OP SOLN
OPHTHALMIC | Status: AC
  Filled 2016-01-12: qty 5

## 2016-01-12 MED ORDER — ATROPINE SULFATE 1 % OP SOLN
OPHTHALMIC | Status: DC | PRN
  Administered 2016-01-12: 2 [drp] via OPHTHALMIC

## 2016-01-12 MED ORDER — BUPIVACAINE HCL (PF) 0.75 % IJ SOLN
INTRAMUSCULAR | Status: AC
  Filled 2016-01-12: qty 10

## 2016-01-12 MED ORDER — CYCLOPENTOLATE HCL 2 % OP SOLN
OPHTHALMIC | Status: AC
  Administered 2016-01-12: 1 [drp] via OPHTHALMIC
  Filled 2016-01-12: qty 2

## 2016-01-12 MED ORDER — ONDANSETRON HCL 4 MG/2ML IJ SOLN
INTRAMUSCULAR | Status: DC | PRN
  Administered 2016-01-12: 4 mg via INTRAVENOUS

## 2016-01-12 MED ORDER — FENTANYL CITRATE (PF) 100 MCG/2ML IJ SOLN
25.0000 ug | INTRAMUSCULAR | Status: DC | PRN
Start: 2016-01-12 — End: 2016-01-12

## 2016-01-12 MED ORDER — ALFENTANIL 500 MCG/ML IJ INJ
INJECTION | INTRAMUSCULAR | Status: DC | PRN
  Administered 2016-01-12 (×2): 500 ug via INTRAVENOUS

## 2016-01-12 MED ORDER — DEXTROSE 50 % IV SOLN
INTRAVENOUS | Status: DC | PRN
  Administered 2016-01-12: 3 mL via INTRAVENOUS

## 2016-01-12 MED ORDER — CYCLOPENTOLATE HCL 2 % OP SOLN
1.0000 [drp] | OPHTHALMIC | Status: AC | PRN
  Administered 2016-01-12 (×3): 1 [drp] via OPHTHALMIC

## 2016-01-12 MED ORDER — NEOMYCIN-POLYMYXIN-DEXAMETH 0.1 % OP OINT
TOPICAL_OINTMENT | OPHTHALMIC | Status: DC | PRN
  Administered 2016-01-12: 1 via OPHTHALMIC

## 2016-01-12 MED ORDER — CEFUROXIME OPHTHALMIC INJECTION 1 MG/0.1 ML
INJECTION | OPHTHALMIC | Status: DC | PRN
  Administered 2016-01-12: .1 mL via INTRACAMERAL

## 2016-01-12 MED ORDER — LIDOCAINE HCL (PF) 4 % IJ SOLN
INTRAMUSCULAR | Status: AC
  Filled 2016-01-12: qty 5

## 2016-01-12 MED ORDER — CEFUROXIME OPHTHALMIC INJECTION 1 MG/0.1 ML
INJECTION | OPHTHALMIC | Status: AC
  Filled 2016-01-12: qty 0.1

## 2016-01-12 MED ORDER — BSS PLUS IO SOLN
INTRAOCULAR | Status: DC | PRN
  Administered 2016-01-12: 1 via INTRAOCULAR

## 2016-01-12 MED ORDER — LIDOCAINE HCL (PF) 4 % IJ SOLN
INTRAMUSCULAR | Status: DC | PRN
  Administered 2016-01-12: 2 mL via OPHTHALMIC

## 2016-01-12 MED ORDER — INDOCYANINE GREEN 25 MG IV SOLR
25.0000 mg | Freq: Once | INTRAVENOUS | Status: DC
  Filled 2016-01-12: qty 25

## 2016-01-12 MED ORDER — MIDAZOLAM HCL 2 MG/2ML IJ SOLN
INTRAMUSCULAR | Status: DC | PRN
  Administered 2016-01-12 (×2): 1 mg via INTRAVENOUS

## 2016-01-12 MED ORDER — TETRACAINE HCL 0.5 % OP SOLN
OPHTHALMIC | Status: AC
  Filled 2016-01-12: qty 2

## 2016-01-12 MED ORDER — DEXAMETHASONE SODIUM PHOSPHATE 10 MG/ML IJ SOLN
INTRAMUSCULAR | Status: DC | PRN
  Administered 2016-01-12: 1 mL

## 2016-01-12 MED ORDER — ONDANSETRON HCL 4 MG/2ML IJ SOLN: 4.0000 mg | Freq: Once | INTRAMUSCULAR | Status: DC | PRN

## 2016-01-12 MED ORDER — TETRACAINE HCL 0.5 % OP SOLN
OPHTHALMIC | Status: DC | PRN
  Administered 2016-01-12: 2 [drp] via OPHTHALMIC

## 2016-01-12 MED ORDER — DEXTROSE 50 % IV SOLN
INTRAVENOUS | Status: AC
  Filled 2016-01-12: qty 50

## 2016-01-12 MED ORDER — DEXAMETHASONE SODIUM PHOSPHATE 10 MG/ML IJ SOLN
INTRAMUSCULAR | Status: AC
  Filled 2016-01-12: qty 1

## 2016-01-12 MED ORDER — HYPROMELLOSE 0.3 % OP GEL
OPHTHALMIC | Status: AC
  Filled 2016-01-12: qty 3.5

## 2016-01-12 MED ORDER — PHENYLEPHRINE HCL 10 % OP SOLN
1.0000 [drp] | OPHTHALMIC | Status: AC | PRN
  Administered 2016-01-12 (×3): 1 [drp] via OPHTHALMIC

## 2016-01-12 MED ORDER — INDOCYANINE GREEN 25 MG IV SOLR
12.5000 mg | INTRAVENOUS | Status: DC | PRN
  Administered 2016-01-12: 12.5 mg via TOPICAL

## 2016-01-12 MED ORDER — HYALURONIDASE HUMAN 150 UNIT/ML IJ SOLN
INTRAMUSCULAR | Status: AC
  Filled 2016-01-12: qty 1

## 2016-01-12 MED ORDER — HYPROMELLOSE 0.3 % OP GEL
OPHTHALMIC | Status: DC | PRN
  Administered 2016-01-12: 1 via OPHTHALMIC

## 2016-01-12 MED ORDER — SODIUM CHLORIDE 0.9 % IV SOLN
INTRAVENOUS | Status: DC
  Administered 2016-01-12 (×2): via INTRAVENOUS

## 2016-01-12 MED ORDER — NEOMYCIN-POLYMYXIN-DEXAMETH 3.5-10000-0.1 OP OINT
TOPICAL_OINTMENT | OPHTHALMIC | Status: AC
  Filled 2016-01-12: qty 3.5

## 2016-01-12 MED ORDER — PHENYLEPHRINE HCL 10 % OP SOLN
OPHTHALMIC | Status: AC
  Administered 2016-01-12: 1 [drp] via OPHTHALMIC
  Filled 2016-01-12: qty 5

## 2016-01-12 MED ORDER — BSS PLUS IO SOLN
Freq: Once | INTRAOCULAR | Status: DC
  Filled 2016-01-12: qty 500

## 2016-01-12 MED ORDER — TRIAMCINOLONE ACETONIDE 40 MG/ML IJ SUSP
INTRAMUSCULAR | Status: AC
  Filled 2016-01-12: qty 1

## 2016-01-12 SURGICAL SUPPLY — 34 items
APPLICATOR COTTON TIP 6IN STRL (MISCELLANEOUS) ×12 IMPLANT
CANNULA SOFT TIP 25G (CANNULA) ×3 IMPLANT
CORD BIP STRL DISP 12FT (MISCELLANEOUS) IMPLANT
CUP MEDICINE 2OZ PLAST GRAD ST (MISCELLANEOUS) ×3 IMPLANT
ERASER HMR WETFIELD 25G (MISCELLANEOUS) IMPLANT
FILTER MILLEX .045 (MISCELLANEOUS) ×1 IMPLANT
FLTR MILLEX .045 (MISCELLANEOUS) ×3
FORCEPS GRIESH GRASP 25G (INSTRUMENTS) IMPLANT
FORCEPS GRIESH ILM PLUS 25G (INSTRUMENTS) ×3 IMPLANT
GLOVE BIO SURGEON STRL SZ8 (GLOVE) ×3 IMPLANT
GLOVE SURG LX 6.5 MICRO (GLOVE) ×2
GLOVE SURG LX STRL 6.5 MICRO (GLOVE) ×1 IMPLANT
GLOVE SURG PR MICRO ENCORE 7 (GLOVE) ×3 IMPLANT
GOWN STRL REUS W/ TWL LRG LVL3 (GOWN DISPOSABLE) ×2 IMPLANT
GOWN STRL REUS W/TWL LRG LVL3 (GOWN DISPOSABLE) ×4
IV D5W 50ML SINGLE PACK (IV SOLUTION) ×3 IMPLANT
IV SET STOPCOCK EXT 40 2IN (SET/KITS/TRAYS/PACK) ×3 IMPLANT
LENS BIOM OPTIC SET 200MM DISP (MISCELLANEOUS) ×3 IMPLANT
LENS VITRECTOMY FLAT DISP (MISCELLANEOUS) ×3 IMPLANT
NDL RETROBULBAR .5 NSTRL (NEEDLE) ×3 IMPLANT
NEEDLE FILTER BLUNT 18X 1/2SAF (NEEDLE) ×2
NEEDLE FILTER BLUNT 18X1 1/2 (NEEDLE) ×1 IMPLANT
NEEDLE HYPO 30X.5 LL (NEEDLE) ×3 IMPLANT
PACK EYE AFTER SURG (MISCELLANEOUS) ×3 IMPLANT
PACK VITRECTOMY (MISCELLANEOUS) ×3 IMPLANT
PACK VITRECTOMY CASSETTE 25GA (MISCELLANEOUS) ×3 IMPLANT
PROBE DIRECTIONAL LASER (MISCELLANEOUS) IMPLANT
PROBE LASER ILLUM FLEX CVD 25G (OPHTHALMIC) IMPLANT
SOL PREP PVP 2OZ (MISCELLANEOUS) ×3
SOLUTION PREP PVP 2OZ (MISCELLANEOUS) ×1 IMPLANT
STRAP SAFETY BODY (MISCELLANEOUS) ×3 IMPLANT
SUT VICRYL 7 0 TG140 8 (SUTURE) ×3 IMPLANT
SYR 50ML LL SCALE MARK (SYRINGE) ×3 IMPLANT
SYRINGE 10CC LL (SYRINGE) ×3 IMPLANT

## 2016-01-12 NOTE — Transfer of Care (Signed)
Immediate Anesthesia Transfer of Care Note  Patient: Crystal Arnold  Procedure(s) Performed: Procedure(s) with comments: PARS PLANA VITRECTOMY WITH 25 GAUGE (Left) - SF6:  14% MEMBRANE PEEL (Left)  Patient Location: PACU  Anesthesia Type:MAC  Level of Consciousness: awake, alert  and oriented  Airway & Oxygen Therapy: Patient Spontanous Breathing and Patient connected to nasal cannula oxygen  Post-op Assessment: Report given to RN, Post -op Vital signs reviewed and stable and Patient moving all extremities  Post vital signs: Reviewed and stable  Last Vitals:  Filed Vitals:   01/12/16 0632  BP: 134/59  Pulse: 53  Temp: 36.6 C  Resp: 18    Complications: No apparent anesthesia complications

## 2016-01-12 NOTE — Anesthesia Postprocedure Evaluation (Signed)
Anesthesia Post Note  Patient: Crystal Arnold  Procedure(s) Performed: Procedure(s) (LRB): PARS PLANA VITRECTOMY WITH 25 GAUGE (Left) MEMBRANE PEEL (Left)  Patient location during evaluation: PACU Anesthesia Type: MAC Level of consciousness: awake Pain management: pain level controlled Vital Signs Assessment: post-procedure vital signs reviewed and stable Respiratory status: spontaneous breathing Cardiovascular status: blood pressure returned to baseline Postop Assessment: no headache Anesthetic complications: no    Last Vitals:  Filed Vitals:   01/12/16 0852 01/12/16 0907  BP: 155/63 151/68  Pulse:  51  Temp:    Resp: 15 12    Last Pain: There were no vitals filed for this visit.               Prezley Qadir M

## 2016-01-12 NOTE — Discharge Instructions (Signed)

## 2016-01-12 NOTE — Op Note (Signed)
.  INDICATIONS & JUSTIFICATIONS FOR SURGERY: The patient was evaluated in the clinic for an epiretinal membrane visually significant in the left eye. The patient did have changes invision.  After the risks, benefits, alternatives, and complications were discussed, the patient elected to proceed with pars plana vitrectomy in the left eye with membrane peeling.      PREOPERATIVE DIAGNOSIS: Epiretinal membrane, left eye.          POST OPERATIVE DIAGNOSIS: Epiretinal membrane, left eye.             OPERATION PERFORMED: 25-gauge pars plana vitrectomy with ICG, ERM and ILM peeling, air-fluid exchange and air, left eye.                 ANESTHESIA: MAC with retrobulbar block.   COMPLICATIONS: None.     BLOOD LOSS: Minimal.   SPECIMENS: None.   DESCRIPTION OF PROCEDURE: On the day of surgery, the patient was greeted in the preoperative holding area.  The left eye was marked.  Any questions were answered.  The patient was then brought into the operating room in the supine position.  Next, 5 ml of a retrobulbar block consisting of 4% xylocaine plain, 0.75% sensorcaine plain and hylenex 100u was injected. The right eye was then prepped and draped in the usual sterile fashion.  Three 25-gauge trocars were used in the usual position. The infusion cannula was checked to ensure it was in the vitreous cavity prior to starting the infusion.  The patient had a previous vitrectomy, so there was only peripheral gel to remove.  ICG was applied to the retinal surface and then aspirated.  An ILM forcep was then used to remove the epiretinal membranes and subsequently the ILM for an area of one disc diameter around the foveal center.  Attention was then turned back to the periphery.  A 360 degree scleral depressed exam was performed.  There were no retinal tears noted.   An air-fluid exchange was then performed.  Four times the vitreous volume of 14% SF6 was infused into the vitreous cavity.  The trocars were removed.  The  superonasal trocar site was sutured.  The pressure was felt to be acceptable by palpation.  Subconjunctival cefuroxime and dexamethasone were injected.  The patient was patched and shielded and taken to the recovery area in the stable condition.

## 2016-01-12 NOTE — Anesthesia Preprocedure Evaluation (Signed)
Anesthesia Evaluation  Patient identified by MRN, date of birth, ID band Patient awake    Airway Mallampati: II  TM Distance: >3 FB Neck ROM: Limited    Dental  (+) Teeth Intact   Pulmonary shortness of breath and with exertion, sleep apnea and Continuous Positive Airway Pressure Ventilation , COPD,  COPD inhaler,  Does NOT use CPAP.   Pulmonary exam normal        Cardiovascular Exercise Tolerance: Poor hypertension, Pt. on medications Normal cardiovascular exam     Neuro/Psych    GI/Hepatic hiatal hernia, GERD  Medicated and Controlled,  Endo/Other  diabetes, Type 2BG 117.  Renal/GU      Musculoskeletal   Abdominal (+)  Abdomen: soft.    Peds  Hematology   Anesthesia Other Findings   Reproductive/Obstetrics                             Anesthesia Physical Anesthesia Plan  ASA: III  Anesthesia Plan: MAC   Post-op Pain Management:    Induction: Intravenous  Airway Management Planned: Nasal Cannula  Additional Equipment:   Intra-op Plan:   Post-operative Plan:   Informed Consent: I have reviewed the patients History and Physical, chart, labs and discussed the procedure including the risks, benefits and alternatives for the proposed anesthesia with the patient or authorized representative who has indicated his/her understanding and acceptance.     Plan Discussed with: CRNA  Anesthesia Plan Comments:         Anesthesia Quick Evaluation

## 2016-01-12 NOTE — H&P (Signed)
.  Previous H&P scanned in reviewed, patient examined, and no interval changes.  Please see scanned record for complete information.   

## 2016-02-09 DIAGNOSIS — F329 Major depressive disorder, single episode, unspecified: Secondary | ICD-10-CM | POA: Diagnosis not present

## 2016-02-09 DIAGNOSIS — E785 Hyperlipidemia, unspecified: Secondary | ICD-10-CM | POA: Diagnosis not present

## 2016-02-09 DIAGNOSIS — R739 Hyperglycemia, unspecified: Secondary | ICD-10-CM | POA: Diagnosis not present

## 2016-02-09 DIAGNOSIS — I10 Essential (primary) hypertension: Secondary | ICD-10-CM | POA: Diagnosis not present

## 2016-02-09 DIAGNOSIS — M810 Age-related osteoporosis without current pathological fracture: Secondary | ICD-10-CM | POA: Diagnosis not present

## 2016-02-09 DIAGNOSIS — J302 Other seasonal allergic rhinitis: Secondary | ICD-10-CM | POA: Diagnosis not present

## 2016-02-09 DIAGNOSIS — F0390 Unspecified dementia without behavioral disturbance: Secondary | ICD-10-CM | POA: Diagnosis not present

## 2016-02-09 DIAGNOSIS — E559 Vitamin D deficiency, unspecified: Secondary | ICD-10-CM | POA: Diagnosis not present

## 2016-02-09 DIAGNOSIS — R251 Tremor, unspecified: Secondary | ICD-10-CM | POA: Diagnosis not present

## 2016-02-09 DIAGNOSIS — M545 Low back pain: Secondary | ICD-10-CM | POA: Diagnosis not present

## 2016-02-09 DIAGNOSIS — K210 Gastro-esophageal reflux disease with esophagitis: Secondary | ICD-10-CM | POA: Diagnosis not present

## 2016-02-09 DIAGNOSIS — E038 Other specified hypothyroidism: Secondary | ICD-10-CM | POA: Diagnosis not present

## 2016-02-18 DIAGNOSIS — H35372 Puckering of macula, left eye: Secondary | ICD-10-CM | POA: Diagnosis not present

## 2016-04-07 DIAGNOSIS — H35372 Puckering of macula, left eye: Secondary | ICD-10-CM | POA: Diagnosis not present

## 2016-04-24 ENCOUNTER — Encounter: Payer: Self-pay | Admitting: *Deleted

## 2016-04-24 ENCOUNTER — Ambulatory Visit
Admission: EM | Admit: 2016-04-24 | Discharge: 2016-04-24 | Disposition: A | Discharge status: Home / Self Care | Payer: Medicare Other | Attending: Family Medicine | Admitting: Family Medicine

## 2016-04-24 DIAGNOSIS — R350 Frequency of micturition: Secondary | ICD-10-CM | POA: Diagnosis present

## 2016-04-24 DIAGNOSIS — M810 Age-related osteoporosis without current pathological fracture: Secondary | ICD-10-CM | POA: Insufficient documentation

## 2016-04-24 DIAGNOSIS — J449 Chronic obstructive pulmonary disease, unspecified: Secondary | ICD-10-CM | POA: Diagnosis not present

## 2016-04-24 DIAGNOSIS — G4730 Sleep apnea, unspecified: Secondary | ICD-10-CM | POA: Diagnosis not present

## 2016-04-24 DIAGNOSIS — I341 Nonrheumatic mitral (valve) prolapse: Secondary | ICD-10-CM | POA: Diagnosis not present

## 2016-04-24 DIAGNOSIS — K589 Irritable bowel syndrome without diarrhea: Secondary | ICD-10-CM | POA: Diagnosis not present

## 2016-04-24 DIAGNOSIS — K219 Gastro-esophageal reflux disease without esophagitis: Secondary | ICD-10-CM | POA: Diagnosis not present

## 2016-04-24 DIAGNOSIS — F329 Major depressive disorder, single episode, unspecified: Secondary | ICD-10-CM | POA: Insufficient documentation

## 2016-04-24 DIAGNOSIS — G8929 Other chronic pain: Secondary | ICD-10-CM | POA: Diagnosis not present

## 2016-04-24 DIAGNOSIS — I10 Essential (primary) hypertension: Secondary | ICD-10-CM | POA: Diagnosis not present

## 2016-04-24 DIAGNOSIS — F4310 Post-traumatic stress disorder, unspecified: Secondary | ICD-10-CM | POA: Insufficient documentation

## 2016-04-24 DIAGNOSIS — Z7982 Long term (current) use of aspirin: Secondary | ICD-10-CM | POA: Diagnosis not present

## 2016-04-24 DIAGNOSIS — Z91048 Other nonmedicinal substance allergy status: Secondary | ICD-10-CM | POA: Diagnosis not present

## 2016-04-24 DIAGNOSIS — Z91013 Allergy to seafood: Secondary | ICD-10-CM | POA: Diagnosis not present

## 2016-04-24 DIAGNOSIS — E7800 Pure hypercholesterolemia, unspecified: Secondary | ICD-10-CM | POA: Insufficient documentation

## 2016-04-24 DIAGNOSIS — E039 Hypothyroidism, unspecified: Secondary | ICD-10-CM | POA: Diagnosis not present

## 2016-04-24 DIAGNOSIS — H9190 Unspecified hearing loss, unspecified ear: Secondary | ICD-10-CM | POA: Insufficient documentation

## 2016-04-24 DIAGNOSIS — Z79899 Other long term (current) drug therapy: Secondary | ICD-10-CM | POA: Insufficient documentation

## 2016-04-24 DIAGNOSIS — N390 Urinary tract infection, site not specified: Secondary | ICD-10-CM | POA: Insufficient documentation

## 2016-04-24 DIAGNOSIS — Z882 Allergy status to sulfonamides status: Secondary | ICD-10-CM | POA: Insufficient documentation

## 2016-04-24 DIAGNOSIS — R300 Dysuria: Secondary | ICD-10-CM | POA: Diagnosis present

## 2016-04-24 DIAGNOSIS — R3915 Urgency of urination: Secondary | ICD-10-CM | POA: Diagnosis present

## 2016-04-24 LAB — URINALYSIS COMPLETE WITH MICROSCOPIC (ARMC ONLY)
BILIRUBIN URINE: NEGATIVE
Glucose, UA: NEGATIVE mg/dL
KETONES UR: NEGATIVE mg/dL
NITRITE: NEGATIVE
PH: 7 (ref 5.0–8.0)
Protein, ur: NEGATIVE mg/dL
RBC / HPF: NONE SEEN RBC/hpf (ref 0–5)
Specific Gravity, Urine: 1.01 (ref 1.005–1.030)
Squamous Epithelial / LPF: NONE SEEN

## 2016-04-24 MED ORDER — CEFUROXIME AXETIL 500 MG PO TABS: 500.0000 mg | ORAL_TABLET | Freq: Two times a day (BID) | ORAL | Status: DC

## 2016-04-24 MED ORDER — PHENAZOPYRIDINE HCL 200 MG PO TABS: 200.0000 mg | ORAL_TABLET | Freq: Three times a day (TID) | ORAL | Status: DC | PRN

## 2016-04-24 NOTE — Discharge Instructions (Signed)
Dysuria Dysuria is pain or discomfort while urinating. The pain or discomfort may be felt in the tube that carries urine out of the bladder (urethra) or in the surrounding tissue of the genitals. The pain may also be felt in the groin area, lower abdomen, and lower back. You may have to urinate frequently or have the sudden feeling that you have to urinate (urgency). Dysuria can affect both men and women, but is more common in women. Dysuria can be caused by many different things, including:  Urinary tract infection in women.  Infection of the kidney or bladder.  Kidney stones or bladder stones.  Certain sexually transmitted infections (STIs), such as chlamydia.  Dehydration.  Inflammation of the vagina.  Use of certain medicines.  Use of certain soaps or scented products that cause irritation. HOME CARE INSTRUCTIONS Watch your dysuria for any changes. The following actions may help to reduce any discomfort you are feeling:  Drink enough fluid to keep your urine clear or pale yellow.  Empty your bladder often. Avoid holding urine for long periods of time.  After a bowel movement or urination, women should cleanse from front to back, using each tissue only once.  Empty your bladder after sexual intercourse.  Take medicines only as directed by your health care provider.  If you were prescribed an antibiotic medicine, finish it all even if you start to feel better.  Avoid caffeine, tea, and alcohol. They can irritate the bladder and make dysuria worse. In men, alcohol may irritate the prostate.  Keep all follow-up visits as directed by your health care provider. This is important.  If you had any tests done to find the cause of dysuria, it is your responsibility to obtain your test results. Ask the lab or department performing the test when and how you will get your results. Talk with your health care provider if you have any questions about your results. SEEK MEDICAL CARE  IF:  You develop pain in your back or sides.  You have a fever.  You have nausea or vomiting.  You have blood in your urine.  You are not urinating as often as you usually do. SEEK IMMEDIATE MEDICAL CARE IF:  You pain is severe and not relieved with medicines.  You are unable to hold down any fluids.  You or someone else notices a change in your mental function.  You have a rapid heartbeat at rest.  You have shaking or chills.  You feel extremely weak.   This information is not intended to replace advice given to you by your health care provider. Make sure you discuss any questions you have with your health care provider.   Document Released: 08/18/2004 Document Revised: 12/11/2014 Document Reviewed: 07/16/2014 Elsevier Interactive Patient Education 2016 [employer]  Urinary Tract Infection A urinary tract infection (UTI) can occur any place along the urinary tract. The tract includes the kidneys, ureters, bladder, and urethra. A type of germ called bacteria often causes a UTI. UTIs are often helped with antibiotic medicine.  HOME CARE   If given, take antibiotics as told by your doctor. Finish them even if you start to feel better.  Drink enough fluids to keep your pee (urine) clear or pale yellow.  Avoid tea, drinks with caffeine, and bubbly (carbonated) drinks.  Pee often. Avoid holding your pee in for a long time.  Pee before and after having sex (intercourse).  Wipe from front to back after you poop (bowel movement) if you are a  woman. Use each tissue only once. GET HELP RIGHT AWAY IF:   You have back pain.  You have lower belly (abdominal) pain.  You have chills.  You feel sick to your stomach (nauseous).  You throw up (vomit).  Your burning or discomfort with peeing does not go away.  You have a fever.  Your symptoms are not better in 3 days. MAKE SURE YOU:   Understand these instructions.  Will watch your condition.  Will get help right  away if you are not doing well or get worse.   This information is not intended to replace advice given to you by your health care provider. Make sure you discuss any questions you have with your health care provider.   Document Released: 05/08/2008 Document Revised: 12/11/2014 Document Reviewed: 06/20/2012 Elsevier Interactive Patient Education [employer].

## 2016-04-24 NOTE — ED Provider Notes (Signed)
CSN: [id number]     Arrival date & time 04/24/16  1623 History   First MD Initiated Contact with Patient 04/24/16 1738    Nurses notes were reviewed. Chief Complaint  Patient presents with  . Dysuria  . Urinary Frequency  . Urinary Urgency    Patient's here because of urinary frequency urgency and dysuria. Her daughter is with her states having other medical problems. However in looking at the her history she has a history of heart murmur hypertension mitral valve disease irritable bowels and she is on Aricept for apparently cognitive ability challenges. Once again she is not having sniffed medical problems. They states that she doesn't smoke however she does also history of COPD which would indicate that if she didn't smoke now she smoked in the past was exposed to significant amount of secondhand smoke in the past.   Family medical history usually nothing pertinent today can tell me. She is allergic to sulfa drugs.  Everything started on Friday. According to the daughter she's been doing a lot of traveling in which the child will she doesn't drink well which is drink while she doesn't flush kidneys out MG and flush kidneys out she gets urinary tract infections.      (Consider location/radiation/quality/duration/timing/severity/associated sxs/prior Treatment) Patient is a 80 y.o. female presenting with dysuria and frequency. The history is provided by the patient. No language interpreter was used.  Dysuria Pain severity:  Moderate Timing:  Constant Progression:  Waxing and waning Chronicity:  New Recent urinary tract infections: no   Relieved by:  Nothing Urinary symptoms: discolored urine, foul-smelling urine and frequent urination   Associated symptoms: no abdominal pain, no genital lesions, no nausea and no vaginal discharge   Risk factors: recurrent urinary tract infections   Risk factors: no renal disease and not sexually active   Urinary Frequency This is a new problem.  Pertinent negatives include no abdominal pain.    Past Medical History  Diagnosis Date  . Hypertension   . Heart murmur   . Mitral valve disorder     prolaspe  . GERD (gastroesophageal reflux disease)   . History of stomach ulcers   . IBS (irritable bowel syndrome)   . Depression   . HOH (hard of hearing)     aids  . Hypothyroidism   . Back pain, chronic   . Sleep apnea     cpap -doesn't use  . Shortness of breath dyspnea     doe  . COPD (chronic obstructive pulmonary disease) ([place])     in past, no issues currently  . Environmental and seasonal allergies   . Hypercholesteremia   . Osteoporosis   . PTSD (post-traumatic stress disorder)   . Left leg weakness     s/p back surgeries  . Motion sickness     cars, boats  . Occasional tremors   . History of hiatal hernia    Past Surgical History  Procedure Laterality Date  . Abdominal hysterectomy    . Nasal septum surgery    . Tonsillectomy    . Mastoidectomy    . Pars plana vitrectomy Left 04/14/2015    Procedure: PARS PLANA VITRECTOMY WITH 25 GAUGE, endolaser, sf6 gas exchange left eye;  Surgeon: Milus Height, MD;  Location: [facility];  Service: Ophthalmology;  Laterality: Left;  Marland Kitchen Eye surgery      left detach retina  . Cataract extraction w/phaco Left 08/25/2015    Procedure: CATARACT EXTRACTION PHACO AND INTRAOCULAR LENS PLACEMENT (IOC) WITH  IOL COMPLICATED;  Surgeon: Leandrew Koyanagi, MD;  Location: [place];  Service: Ophthalmology;  Laterality: Left;  IVA BLOCK CPAP  . Back surgery      times 3  . Excision of skin tag      leisions  . Pars plana vitrectomy Left 01/12/2016    Procedure: PARS PLANA VITRECTOMY WITH 25 GAUGE;  Surgeon: Milus Height, MD;  Location: [facility];  Service: Ophthalmology;  Laterality: Left;  SF6:  14% casette lot [id number] H exp  05/2017  . Membrane peel Left 01/12/2016    Procedure: MEMBRANE PEEL;  Surgeon: Milus Height, MD;  Location: [facility];  Service: Ophthalmology;   Laterality: Left;   History reviewed. No pertinent family history. Social History  Substance Use Topics  . Smoking status: Never Smoker   . Smokeless tobacco: None  . Alcohol Use: No   OB History    No data available     Review of Systems  Gastrointestinal: Negative for nausea and abdominal pain.  Genitourinary: Positive for dysuria and frequency. Negative for vaginal discharge.  All other systems reviewed and are negative.   Allergies  Other; Pumpkin seed oil; Shellfish allergy; Shellfish-derived products; Spinach; and Sulfa antibiotics  Home Medications   Prior to Admission medications   Medication Sig Start Date End Date Taking? Authorizing Provider  aspirin 81 MG tablet Take by mouth daily.   Yes Historical Provider, MD  atorvastatin (LIPITOR) 40 MG tablet Take 40 mg by mouth daily. PM   Yes Historical Provider, MD  calcium-vitamin D (OSCAL WITH D) 500-200 MG-UNIT tablet Take 1 tablet by mouth.   Yes Historical Provider, MD  clonazePAM (KLONOPIN) 0.5 MG tablet Take 0.5 mg by mouth every morning.    Yes Historical Provider, MD  Difluprednate (DUREZOL) 0.05 % EMUL Apply 1 drop to eye at bedtime. Reported on 01/12/2016   Yes Historical Provider, MD  donepezil (ARICEPT) 10 MG tablet Take 10 mg by mouth at bedtime. AM   Yes Historical Provider, MD  esomeprazole (NEXIUM) 40 MG packet Take 40 mg by mouth daily before breakfast.   Yes Historical Provider, MD  felodipine (PLENDIL) 2.5 MG 24 hr tablet Take 2.5 mg by mouth at bedtime. PM   Yes Historical Provider, MD  fluticasone (FLONASE) 50 MCG/ACT nasal spray Place 1 spray into both nostrils every morning.   Yes Historical Provider, MD  gabapentin (NEURONTIN) 100 MG capsule Take 100 mg by mouth 2 (two) times daily.    Yes Historical Provider, MD  hydrochlorothiazide (MICROZIDE) 12.5 MG capsule Take by mouth daily.   Yes Historical Provider, MD  levothyroxine (SYNTHROID, LEVOTHROID) 88 MCG tablet Take 88 mcg by mouth daily before  breakfast.   Yes Historical Provider, MD  lisinopril (PRINIVIL,ZESTRIL) 30 MG tablet Take 30 mg by mouth at bedtime.   Yes Historical Provider, MD  metFORMIN (GLUCOPHAGE) 500 MG tablet Take 500 mg by mouth 2 (two) times daily with a meal.   Yes Historical Provider, MD  Multiple Vitamin (MULTIVITAMIN) capsule Take 1 capsule by mouth daily.   Yes Historical Provider, MD  Multiple Vitamins-Minerals (PRESERVISION AREDS 2 PO) Take 1 tablet by mouth 2 (two) times daily.    Yes Historical Provider, MD  nepafenac (ILEVRO) 0.3 % ophthalmic suspension 1 drop at bedtime. Reported on 01/12/2016   Yes Historical Provider, MD  simvastatin (ZOCOR) 20 MG tablet Take 20 mg by mouth daily at 6 PM.   Yes Historical Provider, MD  triamterene-hydrochlorothiazide (MAXZIDE) 75-50 MG per tablet Take 0.5 tablets by  mouth daily.   Yes Historical Provider, MD  albuterol (PROVENTIL HFA;VENTOLIN HFA) 108 (90 BASE) MCG/ACT inhaler Inhale 1-2 puffs into the lungs every 6 (six) hours as needed for wheezing or shortness of breath. 06/04/15   Paulina Fusi, MD  buPROPion (WELLBUTRIN) 100 MG tablet Take 150 mg by mouth 2 (two) times daily.     Historical Provider, MD  cefUROXime (CEFTIN) 500 MG tablet Take 1 tablet (500 mg total) by mouth 2 (two) times daily. 04/24/16   Frederich Cha, MD  HYDROcodone-acetaminophen (NORCO/VICODIN) 5-325 MG per tablet Take 1 tablet by mouth every 6 (six) hours as needed for moderate pain.    Historical Provider, MD  phenazopyridine (PYRIDIUM) 200 MG tablet Take 1 tablet (200 mg total) by mouth 3 (three) times daily as needed for pain. 04/24/16   Frederich Cha, MD  sertraline (ZOLOFT) 100 MG tablet Take 100 mg by mouth 2 (two) times daily.     Historical Provider, MD  traMADol (ULTRAM) 50 MG tablet Take by mouth every 4 (four) hours as needed.    Historical Provider, MD  Travoprost, BAK Free, (TRAVATAN) 0.004 % SOLN ophthalmic solution     Historical Provider, MD  vitamin E 100 UNIT capsule Take by mouth daily.     Historical Provider, MD   Meds Ordered and Administered this Visit  Medications - No data to display  BP 124/63 mmHg  Pulse 63  Temp(Src) 98.1 F (36.7 C) (Oral)  Resp 16  Ht 5' (1.524 m)  Wt 124 lb (56.246 kg)  BMI 24.22 kg/m2  SpO2 99% No data found.   Physical Exam  Constitutional: She appears well-developed and well-nourished.  HENT:  Head: Normocephalic and atraumatic.  Eyes: Pupils are equal, round, and reactive to light.  Neck: Neck supple.  Abdominal: There is tenderness in the suprapubic area. There is no CVA tenderness.  Musculoskeletal: Normal range of motion. She exhibits no edema.  Neurological: She is alert.  Skin: Skin is warm.  Psychiatric: She has a normal mood and affect.  Vitals reviewed.   ED Course  Procedures (including critical care time)  Labs Review Labs Reviewed  URINALYSIS COMPLETEWITH MICROSCOPIC ([place]) - Abnormal; Notable for the following:    APPearance HAZY (*)    Hgb urine dipstick TRACE (*)    Leukocytes, UA 3+ (*)    Bacteria, UA MANY (*)    All other components within normal limits  URINE CULTURE    Imaging Review No results found.   Visual Acuity Review  Right Eye Distance:   Left Eye Distance:   Bilateral Distance:    Right Eye Near:   Left Eye Near:    Bilateral Near:     Results for orders placed or performed during the hospital encounter of 04/24/16  Urinalysis complete, with microscopic  Result Value Ref Range   Color, Urine YELLOW YELLOW   APPearance HAZY (A) CLEAR   Glucose, UA NEGATIVE NEGATIVE mg/dL   Bilirubin Urine NEGATIVE NEGATIVE   Ketones, ur NEGATIVE NEGATIVE mg/dL   Specific Gravity, Urine 1.010 1.005 - 1.030   Hgb urine dipstick TRACE (A) NEGATIVE   pH 7.0 5.0 - 8.0   Protein, ur NEGATIVE NEGATIVE mg/dL   Nitrite NEGATIVE NEGATIVE   Leukocytes, UA 3+ (A) NEGATIVE   RBC / HPF NONE SEEN 0 - 5 RBC/hpf   WBC, UA TOO NUMEROUS TO COUNT 0 - 5 WBC/hpf   Bacteria, UA MANY (A) NONE SEEN    Squamous Epithelial / LPF NONE  SEEN NONE SEEN      MDM   1. UTI (lower urinary tract infection)    Abnormal urine indicates a very significant UTI. PH 7 we'll place on Ceftin 500 mg 1 tablet twice a day for 7 days also give her some Pyridium for the next 5 days 1 tablet 3 times a day. Follow-up PCP in 2-3 weeks not better.   Note: This dictation was prepared with Dragon dictation along with smaller phrase technology. Any transcriptional errors that result from this process are unintentional.    Frederich Cha, MD 04/24/16 1836

## 2016-04-24 NOTE — ED Notes (Signed)
Dysuria, urinary urgency and frequency since last  Friday. Denies fever.

## 2016-04-27 LAB — URINE CULTURE
Culture: >=100000 — AB
Special Requests: NORMAL

## 2016-05-03 ENCOUNTER — Ambulatory Visit
Admission: EM | Admit: 2016-05-03 | Discharge: 2016-05-03 | Disposition: A | Discharge status: Home / Self Care | Payer: Medicare Other | Attending: Family Medicine | Admitting: Family Medicine

## 2016-05-03 ENCOUNTER — Encounter: Payer: Self-pay | Admitting: *Deleted

## 2016-05-03 DIAGNOSIS — J449 Chronic obstructive pulmonary disease, unspecified: Secondary | ICD-10-CM | POA: Diagnosis not present

## 2016-05-03 DIAGNOSIS — F329 Major depressive disorder, single episode, unspecified: Secondary | ICD-10-CM | POA: Diagnosis not present

## 2016-05-03 DIAGNOSIS — E039 Hypothyroidism, unspecified: Secondary | ICD-10-CM | POA: Insufficient documentation

## 2016-05-03 DIAGNOSIS — F4310 Post-traumatic stress disorder, unspecified: Secondary | ICD-10-CM | POA: Diagnosis not present

## 2016-05-03 DIAGNOSIS — I10 Essential (primary) hypertension: Secondary | ICD-10-CM | POA: Insufficient documentation

## 2016-05-03 DIAGNOSIS — K589 Irritable bowel syndrome without diarrhea: Secondary | ICD-10-CM | POA: Insufficient documentation

## 2016-05-03 DIAGNOSIS — Z79899 Other long term (current) drug therapy: Secondary | ICD-10-CM | POA: Diagnosis not present

## 2016-05-03 DIAGNOSIS — R109 Unspecified abdominal pain: Secondary | ICD-10-CM | POA: Diagnosis present

## 2016-05-03 DIAGNOSIS — E7800 Pure hypercholesterolemia, unspecified: Secondary | ICD-10-CM | POA: Diagnosis not present

## 2016-05-03 DIAGNOSIS — G4733 Obstructive sleep apnea (adult) (pediatric): Secondary | ICD-10-CM | POA: Insufficient documentation

## 2016-05-03 DIAGNOSIS — Z7984 Long term (current) use of oral hypoglycemic drugs: Secondary | ICD-10-CM | POA: Insufficient documentation

## 2016-05-03 DIAGNOSIS — H9190 Unspecified hearing loss, unspecified ear: Secondary | ICD-10-CM | POA: Insufficient documentation

## 2016-05-03 DIAGNOSIS — K219 Gastro-esophageal reflux disease without esophagitis: Secondary | ICD-10-CM | POA: Insufficient documentation

## 2016-05-03 DIAGNOSIS — Z7982 Long term (current) use of aspirin: Secondary | ICD-10-CM | POA: Insufficient documentation

## 2016-05-03 DIAGNOSIS — N390 Urinary tract infection, site not specified: Secondary | ICD-10-CM | POA: Diagnosis not present

## 2016-05-03 DIAGNOSIS — R350 Frequency of micturition: Secondary | ICD-10-CM | POA: Diagnosis present

## 2016-05-03 LAB — URINALYSIS COMPLETE WITH MICROSCOPIC (ARMC ONLY)
BILIRUBIN URINE: NEGATIVE
Bacteria, UA: NONE SEEN
GLUCOSE, UA: NEGATIVE mg/dL
HGB URINE DIPSTICK: NEGATIVE
Ketones, ur: NEGATIVE mg/dL
Leukocytes, UA: NEGATIVE
NITRITE: POSITIVE — AB
Protein, ur: NEGATIVE mg/dL
SPECIFIC GRAVITY, URINE: 1.015 (ref 1.005–1.030)
Squamous Epithelial / LPF: NONE SEEN
pH: 7 (ref 5.0–8.0)

## 2016-05-03 MED ORDER — NITROFURANTOIN MONOHYD MACRO 100 MG PO CAPS: 100.0000 mg | ORAL_CAPSULE | Freq: Two times a day (BID) | ORAL | Status: DC

## 2016-05-03 NOTE — ED Notes (Signed)
Since pt last visit pt UTI symptoms have not improved even while taking antibiotics. Pt is now complaining of abdominal pain.

## 2016-05-03 NOTE — ED Provider Notes (Signed)
CSN: [id number]     Arrival date & time 05/03/16  1321 History   First MD Initiated Contact with Patient 05/03/16 1444     Chief Complaint  Patient presents with  . Abdominal Pain  . Urinary Frequency   (Consider location/radiation/quality/duration/timing/severity/associated sxs/prior Treatment) HPI: Patient presents today with symptoms of suprapubic pain and urinary frequency. She was treated here a little over a week ago for UTI with Ceftin. She states that she completed the course yesterday. She does not feel that her symptoms improved at all while she was on the antibiotic. She denies any fever, chills, vomiting, flank pain, hematuria. Urine culture shows that Escherichia coli was present. Sensitivities were reviewed.  Past Medical History  Diagnosis Date  . Hypertension   . Heart murmur   . Mitral valve disorder     prolaspe  . GERD (gastroesophageal reflux disease)   . History of stomach ulcers   . IBS (irritable bowel syndrome)   . Depression   . HOH (hard of hearing)     aids  . Hypothyroidism   . Back pain, chronic   . Sleep apnea     cpap -doesn't use  . Shortness of breath dyspnea     doe  . COPD (chronic obstructive pulmonary disease) ([place])     in past, no issues currently  . Environmental and seasonal allergies   . Hypercholesteremia   . Osteoporosis   . PTSD (post-traumatic stress disorder)   . Left leg weakness     s/p back surgeries  . Motion sickness     cars, boats  . Occasional tremors   . History of hiatal hernia    Past Surgical History  Procedure Laterality Date  . Abdominal hysterectomy    . Nasal septum surgery    . Tonsillectomy    . Mastoidectomy    . Pars plana vitrectomy Left 04/14/2015    Procedure: PARS PLANA VITRECTOMY WITH 25 GAUGE, endolaser, sf6 gas exchange left eye;  Surgeon: Milus Height, MD;  Location: [facility];  Service: Ophthalmology;  Laterality: Left;  Marland Kitchen Eye surgery      left detach retina  . Cataract extraction w/phaco  Left 08/25/2015    Procedure: CATARACT EXTRACTION PHACO AND INTRAOCULAR LENS PLACEMENT (IOC) WITH IOL COMPLICATED;  Surgeon: Leandrew Koyanagi, MD;  Location: [place];  Service: Ophthalmology;  Laterality: Left;  IVA BLOCK CPAP  . Back surgery      times 3  . Excision of skin tag      leisions  . Pars plana vitrectomy Left 01/12/2016    Procedure: PARS PLANA VITRECTOMY WITH 25 GAUGE;  Surgeon: Milus Height, MD;  Location: [facility];  Service: Ophthalmology;  Laterality: Left;  SF6:  14% casette lot [id number] H exp  05/2017  . Membrane peel Left 01/12/2016    Procedure: MEMBRANE PEEL;  Surgeon: Milus Height, MD;  Location: [facility];  Service: Ophthalmology;  Laterality: Left;   History reviewed. No pertinent family history. Social History  Substance Use Topics  . Smoking status: Never Smoker   . Smokeless tobacco: None  . Alcohol Use: No   OB History    No data available     Review of Systems: Negative except mentioned above.   Allergies  Other; Pumpkin seed oil; Shellfish allergy; Shellfish-derived products; Spinach; and Sulfa antibiotics  Home Medications   Prior to Admission medications   Medication Sig Start Date End Date Taking? Authorizing Provider  aspirin 81 MG tablet Take by mouth daily.  Yes Historical Provider, MD  atorvastatin (LIPITOR) 40 MG tablet Take 40 mg by mouth daily. PM   Yes Historical Provider, MD  buPROPion (WELLBUTRIN) 100 MG tablet Take 150 mg by mouth 2 (two) times daily.    Yes Historical Provider, MD  clonazePAM (KLONOPIN) 0.5 MG tablet Take 0.5 mg by mouth every morning.    Yes Historical Provider, MD  Difluprednate (DUREZOL) 0.05 % EMUL Apply 1 drop to eye at bedtime. Reported on 01/12/2016   Yes Historical Provider, MD  donepezil (ARICEPT) 10 MG tablet Take 10 mg by mouth at bedtime. AM   Yes Historical Provider, MD  esomeprazole (NEXIUM) 40 MG packet Take 40 mg by mouth daily before breakfast.   Yes Historical Provider, MD   felodipine (PLENDIL) 2.5 MG 24 hr tablet Take 2.5 mg by mouth at bedtime. PM   Yes Historical Provider, MD  fluticasone (FLONASE) 50 MCG/ACT nasal spray Place 1 spray into both nostrils every morning.   Yes Historical Provider, MD  gabapentin (NEURONTIN) 100 MG capsule Take 100 mg by mouth 2 (two) times daily.    Yes Historical Provider, MD  hydrochlorothiazide (MICROZIDE) 12.5 MG capsule Take by mouth daily.   Yes Historical Provider, MD  levothyroxine (SYNTHROID, LEVOTHROID) 88 MCG tablet Take 88 mcg by mouth daily before breakfast.   Yes Historical Provider, MD  lisinopril (PRINIVIL,ZESTRIL) 30 MG tablet Take 30 mg by mouth at bedtime.   Yes Historical Provider, MD  metFORMIN (GLUCOPHAGE) 500 MG tablet Take 500 mg by mouth 2 (two) times daily with a meal.   Yes Historical Provider, MD  Multiple Vitamin (MULTIVITAMIN) capsule Take 1 capsule by mouth daily.   Yes Historical Provider, MD  Multiple Vitamins-Minerals (PRESERVISION AREDS 2 PO) Take 1 tablet by mouth 2 (two) times daily.    Yes Historical Provider, MD  nepafenac (ILEVRO) 0.3 % ophthalmic suspension 1 drop at bedtime. Reported on 01/12/2016   Yes Historical Provider, MD  phenazopyridine (PYRIDIUM) 200 MG tablet Take 1 tablet (200 mg total) by mouth 3 (three) times daily as needed for pain. 04/24/16  Yes Frederich Cha, MD  sertraline (ZOLOFT) 100 MG tablet Take 100 mg by mouth 2 (two) times daily.    Yes Historical Provider, MD  traMADol (ULTRAM) 50 MG tablet Take by mouth every 4 (four) hours as needed.   Yes Historical Provider, MD  Travoprost, BAK Free, (TRAVATAN) 0.004 % SOLN ophthalmic solution    Yes Historical Provider, MD  albuterol (PROVENTIL HFA;VENTOLIN HFA) 108 (90 BASE) MCG/ACT inhaler Inhale 1-2 puffs into the lungs every 6 (six) hours as needed for wheezing or shortness of breath. 06/04/15   Paulina Fusi, MD  calcium-vitamin D (OSCAL WITH D) 500-200 MG-UNIT tablet Take 1 tablet by mouth.    Historical Provider, MD  cefUROXime  (CEFTIN) 500 MG tablet Take 1 tablet (500 mg total) by mouth 2 (two) times daily. 04/24/16   Frederich Cha, MD  HYDROcodone-acetaminophen (NORCO/VICODIN) 5-325 MG per tablet Take 1 tablet by mouth every 6 (six) hours as needed for moderate pain.    Historical Provider, MD  simvastatin (ZOCOR) 20 MG tablet Take 20 mg by mouth daily at 6 PM.    Historical Provider, MD  triamterene-hydrochlorothiazide (MAXZIDE) 75-50 MG per tablet Take 0.5 tablets by mouth daily.    Historical Provider, MD  vitamin E 100 UNIT capsule Take by mouth daily.    Historical Provider, MD   Meds Ordered and Administered this Visit  Medications - No data to display  Pulse  96  Temp(Src) 98.2 F (36.8 C) (Oral)  Resp 16  Ht 5' (1.524 m)  Wt 123 lb (55.792 kg)  BMI 24.02 kg/m2  SpO2 96% No data found.   Physical Exam:   GENERAL: NAD HEENT: no pharyngeal erythema, no exudate RESP: CTA B CARD: RRR ABD: +BS, mild suprapubic tenderness, no rebound or guarding, no flank tenderness  NEURO: AAO   ED Course  Procedures (including critical care time)  Labs Review Labs Reviewed  URINALYSIS COMPLETEWITH MICROSCOPIC ([place])    Imaging Review No results found.    MDM  A/P: UTI- UA and UCx daone, will start Macrobid, rest, hydration, seek medical attention if symptoms persist or worsen.     Paulina Fusi, MD 05/03/16 [phone]

## 2016-05-05 LAB — URINE CULTURE: Culture: <10000 — AB

## 2016-05-16 DIAGNOSIS — K210 Gastro-esophageal reflux disease with esophagitis: Secondary | ICD-10-CM | POA: Diagnosis not present

## 2016-05-16 DIAGNOSIS — I10 Essential (primary) hypertension: Secondary | ICD-10-CM | POA: Diagnosis not present

## 2016-05-16 DIAGNOSIS — J302 Other seasonal allergic rhinitis: Secondary | ICD-10-CM | POA: Diagnosis not present

## 2016-05-16 DIAGNOSIS — F329 Major depressive disorder, single episode, unspecified: Secondary | ICD-10-CM | POA: Diagnosis not present

## 2016-05-16 DIAGNOSIS — E785 Hyperlipidemia, unspecified: Secondary | ICD-10-CM | POA: Diagnosis not present

## 2016-05-16 DIAGNOSIS — M545 Low back pain: Secondary | ICD-10-CM | POA: Diagnosis not present

## 2016-05-16 DIAGNOSIS — M810 Age-related osteoporosis without current pathological fracture: Secondary | ICD-10-CM | POA: Diagnosis not present

## 2016-05-16 DIAGNOSIS — F419 Anxiety disorder, unspecified: Secondary | ICD-10-CM | POA: Diagnosis not present

## 2016-05-16 DIAGNOSIS — E038 Other specified hypothyroidism: Secondary | ICD-10-CM | POA: Diagnosis not present

## 2016-05-16 DIAGNOSIS — Z9181 History of falling: Secondary | ICD-10-CM | POA: Diagnosis not present

## 2016-05-16 DIAGNOSIS — Z1389 Encounter for screening for other disorder: Secondary | ICD-10-CM | POA: Diagnosis not present

## 2016-05-16 DIAGNOSIS — F0390 Unspecified dementia without behavioral disturbance: Secondary | ICD-10-CM | POA: Diagnosis not present

## 2016-05-17 DIAGNOSIS — H35372 Puckering of macula, left eye: Secondary | ICD-10-CM | POA: Diagnosis not present

## 2016-05-29 ENCOUNTER — Ambulatory Visit
Admission: EM | Admit: 2016-05-29 | Discharge: 2016-05-29 | Disposition: A | Discharge status: Home / Self Care | Payer: Medicare Other | Attending: Family Medicine | Admitting: Family Medicine

## 2016-05-29 DIAGNOSIS — E7800 Pure hypercholesterolemia, unspecified: Secondary | ICD-10-CM | POA: Diagnosis not present

## 2016-05-29 DIAGNOSIS — R300 Dysuria: Secondary | ICD-10-CM | POA: Insufficient documentation

## 2016-05-29 DIAGNOSIS — E039 Hypothyroidism, unspecified: Secondary | ICD-10-CM | POA: Diagnosis not present

## 2016-05-29 DIAGNOSIS — I10 Essential (primary) hypertension: Secondary | ICD-10-CM | POA: Diagnosis not present

## 2016-05-29 DIAGNOSIS — F4310 Post-traumatic stress disorder, unspecified: Secondary | ICD-10-CM | POA: Diagnosis not present

## 2016-05-29 DIAGNOSIS — G4730 Sleep apnea, unspecified: Secondary | ICD-10-CM | POA: Diagnosis not present

## 2016-05-29 DIAGNOSIS — F329 Major depressive disorder, single episode, unspecified: Secondary | ICD-10-CM | POA: Diagnosis not present

## 2016-05-29 DIAGNOSIS — J449 Chronic obstructive pulmonary disease, unspecified: Secondary | ICD-10-CM | POA: Insufficient documentation

## 2016-05-29 DIAGNOSIS — K589 Irritable bowel syndrome without diarrhea: Secondary | ICD-10-CM | POA: Insufficient documentation

## 2016-05-29 DIAGNOSIS — K219 Gastro-esophageal reflux disease without esophagitis: Secondary | ICD-10-CM | POA: Insufficient documentation

## 2016-05-29 LAB — URINALYSIS COMPLETE WITH MICROSCOPIC (ARMC ONLY)
BILIRUBIN URINE: NEGATIVE
GLUCOSE, UA: NEGATIVE mg/dL
HGB URINE DIPSTICK: NEGATIVE
KETONES UR: NEGATIVE mg/dL
Leukocytes, UA: NEGATIVE
NITRITE: NEGATIVE
Protein, ur: NEGATIVE mg/dL
SPECIFIC GRAVITY, URINE: 1.015 (ref 1.005–1.030)
Squamous Epithelial / LPF: NONE SEEN
pH: 7 (ref 5.0–8.0)

## 2016-05-29 MED ORDER — CEPHALEXIN 500 MG PO CAPS: 500.0000 mg | ORAL_CAPSULE | Freq: Two times a day (BID) | ORAL | Status: DC

## 2016-05-29 NOTE — ED Provider Notes (Signed)
[facility]  ____________________________________________  Time seen: Approximately 11:51 AM  I have reviewed the triage vital signs and the nursing notes.   HISTORY  Chief Complaint Urinary Tract Infection  HPI Crystal Arnold is a 80 y.o. female presents for the complaint of 4-5 days of some burning with urination, urinary frequency and urgency as well as some foul-smelling urine. Patient reports that she does have a history of urinary tract infections and states the last was less than a month ago and treated with oral Macrobid. Patient reports that she is concerned that she has another UTI.  Patient denies any other complaints. Denies abdominal pain, vaginal discharge, vaginal odor, vaginal bleeding, back pain, fevers, nausea, vomiting, diarrhea, recent sickness, fall, trauma, dizziness, weakness, chest pain or shortness of breath. Patient reports has remained active and feeling well otherwise.Denies any history of renal insufficiency or renal problems.  Wende Neighbors, MD PCP   Past Medical History  Diagnosis Date  . Hypertension   . Heart murmur   . Mitral valve disorder     prolaspe  . GERD (gastroesophageal reflux disease)   . History of stomach ulcers   . IBS (irritable bowel syndrome)   . Depression   . HOH (hard of hearing)     aids  . Hypothyroidism   . Back pain, chronic   . Sleep apnea     cpap -doesn't use  . Shortness of breath dyspnea     doe  . COPD (chronic obstructive pulmonary disease) ([place])     in past, no issues currently  . Environmental and seasonal allergies   . Hypercholesteremia   . Osteoporosis   . PTSD (post-traumatic stress disorder)   . Left leg weakness     s/p back surgeries  . Motion sickness     cars, boats  . Occasional tremors   . History of hiatal hernia     There are no active problems to display for this patient.   Past Surgical History  Procedure Laterality Date  . Abdominal hysterectomy    . Nasal septum  surgery    . Tonsillectomy    . Mastoidectomy    . Pars plana vitrectomy Left 04/14/2015    Procedure: PARS PLANA VITRECTOMY WITH 25 GAUGE, endolaser, sf6 gas exchange left eye;  Surgeon: Milus Height, MD;  Location: [facility];  Service: Ophthalmology;  Laterality: Left;  Marland Kitchen Eye surgery      left detach retina  . Cataract extraction w/phaco Left 08/25/2015    Procedure: CATARACT EXTRACTION PHACO AND INTRAOCULAR LENS PLACEMENT (IOC) WITH IOL COMPLICATED;  Surgeon: Leandrew Koyanagi, MD;  Location: [facility];  Service: Ophthalmology;  Laterality: Left;  IVA BLOCK CPAP  . Back surgery      times 3  . Excision of skin tag      leisions  . Pars plana vitrectomy Left 01/12/2016    Procedure: PARS PLANA VITRECTOMY WITH 25 GAUGE;  Surgeon: Milus Height, MD;  Location: [facility];  Service: Ophthalmology;  Laterality: Left;  SF6:  14% casette lot [id number] H exp  05/2017  . Membrane peel Left 01/12/2016    Procedure: MEMBRANE PEEL;  Surgeon: Milus Height, MD;  Location: [facility];  Service: Ophthalmology;  Laterality: Left;    Current Outpatient Rx  Name  Route  Sig  Dispense  Refill  . albuterol (PROVENTIL HFA;VENTOLIN HFA) 108 (90 BASE) MCG/ACT inhaler   Inhalation   Inhale 1-2 puffs into the lungs every 6 (six) hours as needed for  wheezing or shortness of breath.   1 Inhaler   0   . aspirin 81 MG tablet   Oral   Take by mouth daily.         Marland Kitchen atorvastatin (LIPITOR) 40 MG tablet   Oral   Take 40 mg by mouth daily. PM         . buPROPion (WELLBUTRIN) 100 MG tablet   Oral   Take 150 mg by mouth 2 (two) times daily.          . calcium-vitamin D (OSCAL WITH D) 500-200 MG-UNIT tablet   Oral   Take 1 tablet by mouth.         . clonazePAM (KLONOPIN) 0.5 MG tablet   Oral   Take 0.5 mg by mouth every morning.          . donepezil (ARICEPT) 10 MG tablet   Oral   Take 10 mg by mouth at bedtime. AM         . esomeprazole (NEXIUM) 40 MG packet   Oral   Take 40  mg by mouth daily before breakfast.         . felodipine (PLENDIL) 2.5 MG 24 hr tablet   Oral   Take 2.5 mg by mouth at bedtime. PM         . fluticasone (FLONASE) 50 MCG/ACT nasal spray   Each Nare   Place 1 spray into both nostrils every morning.         . gabapentin (NEURONTIN) 100 MG capsule   Oral   Take 100 mg by mouth 2 (two) times daily.          Marland Kitchen levothyroxine (SYNTHROID, LEVOTHROID) 88 MCG tablet   Oral   Take 88 mcg by mouth daily before breakfast.         . lisinopril (PRINIVIL,ZESTRIL) 30 MG tablet   Oral   Take 30 mg by mouth at bedtime.         . metFORMIN (GLUCOPHAGE) 500 MG tablet   Oral   Take 500 mg by mouth 2 (two) times daily with a meal.         . Multiple Vitamin (MULTIVITAMIN) capsule   Oral   Take 1 capsule by mouth daily.         . Multiple Vitamins-Minerals (PRESERVISION AREDS 2 PO)   Oral   Take 1 tablet by mouth 2 (two) times daily.          . sertraline (ZOLOFT) 100 MG tablet   Oral   Take 100 mg by mouth 2 (two) times daily.          . simvastatin (ZOCOR) 20 MG tablet   Oral   Take 20 mg by mouth daily at 6 PM.         . Travoprost, BAK Free, (TRAVATAN) 0.004 % SOLN ophthalmic solution               . triamterene-hydrochlorothiazide (MAXZIDE) 75-50 MG per tablet   Oral   Take 0.5 tablets by mouth daily.         . vitamin E 100 UNIT capsule   Oral   Take by mouth daily.         Marland Kitchen           Marland Kitchen  Allergies Other; Pumpkin seed oil; Shellfish allergy; Shellfish-derived products; Spinach; and Sulfa antibiotics  History reviewed. No pertinent family history.  Social History Social History  Substance Use Topics  . Smoking status: Never Smoker   . Smokeless tobacco: None  . Alcohol Use: No    Review of Systems Constitutional: No fever/chills Eyes: No visual changes. ENT: No sore  throat. Cardiovascular: Denies chest pain. Respiratory: Denies shortness of breath. Gastrointestinal: No abdominal pain.  No nausea, no vomiting.  No diarrhea.  No constipation. Genitourinary: Positive for dysuria. Musculoskeletal: Negative for back pain. Skin: Negative for rash. Neurological: Negative for headaches, focal weakness or numbness.  10-point ROS otherwise negative.  ____________________________________________   PHYSICAL EXAM:  VITAL SIGNS: ED Triage Vitals  Enc Vitals Group     BP 05/29/16 1127 135/63 mmHg     Pulse Rate 05/29/16 1127 72     Resp 05/29/16 1127 18     Temp 05/29/16 1127 97.7 F (36.5 C)     Temp Source 05/29/16 1127 Oral     SpO2 05/29/16 1127 100 %     Weight 05/29/16 1127 117 lb (53.071 kg)     Height 05/29/16 1127 5' (1.524 m)     Head Cir --      Peak Flow --      Pain Score 05/29/16 1141 0     Pain Loc --      Pain Edu? --      Excl. in [place]? --     Constitutional: Alert and oriented. Well appearing and in no acute distress. Eyes: Conjunctivae are normal. PERRL. EOMI. Head: Atraumatic.  Ears: Normal external appearance bilaterally.  Nose: No congestion/rhinnorhea.  Mouth/Throat: Mucous membranes are moist.   Neck: No stridor.  No cervical spine tenderness to palpation. Cardiovascular: Normal rate, regular rhythm. Grossly normal heart sounds.  Good peripheral circulation. Respiratory: Normal respiratory effort.  No retractions. Lungs CTAB. No wheezes, rales or rhonchi. Gastrointestinal: Soft and nontender. No distention. Normal Bowel sounds.   No CVA tenderness. Musculoskeletal: No lower or upper extremity tenderness nor edema.  Ambulatory with steady gait. Neurologic:  Normal speech and language. No gross focal neurologic deficits are appreciated. No gait instability. Skin:  Skin is warm, dry and intact. No rash noted. Psychiatric: Mood and affect are normal. Speech and behavior are  normal.  ____________________________________________   LABS (all labs ordered are listed, but only abnormal results are displayed)  Labs Reviewed  URINALYSIS COMPLETEWITH MICROSCOPIC ([place]) - Abnormal; Notable for the following:    Bacteria, UA RARE (*)    All other components within normal limits  URINE CULTURE    INITIAL IMPRESSION / [place] / ED COURSE  Pertinent labs & imaging results that were available during my care of the patient were reviewed by me and considered in my medical decision making (see chart for details).  Very well-appearing patient. No acute distress. Presents for the complaints of dysuria. Denies other complaints. Abdomen soft and nontender. No CVA tenderness. Vital signs stable. Suspect urinary tract infection. Will await urinalysis.  Patient reports having difficulty getting her urine sample and request to go forward with in and out catheter. In out catheter completed by RN. Urinalysis reviewed. Urinalysis with rare bacteria. Will culture her urine however suspect UTI will go ahead and initiate treatment with oral cephalexin. Discussed in detail with patient to follow-up with her primary care physician in one week for follow-up urinalysis as patient has had recent UTI.Discussed indication, risks and benefits  of medications with patient.  Discussed follow up with Primary care physician this week. Discussed follow up and return parameters including no resolution or any worsening concerns. Patient verbalized understanding and agreed to plan.   ____________________________________________   FINAL CLINICAL IMPRESSION(S) / ED DIAGNOSES  Final diagnoses:  Dysuria     Discharge Medication List as of 05/29/2016  2:07 PM    START taking these medications   Details  cephALEXin (KEFLEX) 500 MG capsule Take 1 capsule (500 mg total) by mouth 2 (two) times daily. For 7 days, Starting 05/29/2016, Until Discontinued, Normal        Note: This  dictation was prepared with Dragon dictation along with smaller phrase technology. Any transcriptional errors that result from this process are unintentional.       Marylene Land, NP 05/29/16 1939  Marylene Land, NP 05/29/16 1941

## 2016-05-29 NOTE — Discharge Instructions (Signed)
Take medication as prescribed. Rest. Drink plenty of fluids.   Follow up with your primary care physician this week for urine follow up. Return to Urgent care for new or worsening concerns.    Dysuria Dysuria is pain or discomfort while urinating. The pain or discomfort may be felt in the tube that carries urine out of the bladder (urethra) or in the surrounding tissue of the genitals. The pain may also be felt in the groin area, lower abdomen, and lower back. You may have to urinate frequently or have the sudden feeling that you have to urinate (urgency). Dysuria can affect both men and women, but is more common in women. Dysuria can be caused by many different things, including:  Urinary tract infection in women.  Infection of the kidney or bladder.  Kidney stones or bladder stones.  Certain sexually transmitted infections (STIs), such as chlamydia.  Dehydration.  Inflammation of the vagina.  Use of certain medicines.  Use of certain soaps or scented products that cause irritation. HOME CARE INSTRUCTIONS Watch your dysuria for any changes. The following actions may help to reduce any discomfort you are feeling:  Drink enough fluid to keep your urine clear or pale yellow.  Empty your bladder often. Avoid holding urine for long periods of time.  After a bowel movement or urination, women should cleanse from front to back, using each tissue only once.  Empty your bladder after sexual intercourse.  Take medicines only as directed by your health care provider.  If you were prescribed an antibiotic medicine, finish it all even if you start to feel better.  Avoid caffeine, tea, and alcohol. They can irritate the bladder and make dysuria worse. In men, alcohol may irritate the prostate.  Keep all follow-up visits as directed by your health care provider. This is important.  If you had any tests done to find the cause of dysuria, it is your responsibility to obtain your test  results. Ask the lab or department performing the test when and how you will get your results. Talk with your health care provider if you have any questions about your results. SEEK MEDICAL CARE IF:  You develop pain in your back or sides.  You have a fever.  You have nausea or vomiting.  You have blood in your urine.  You are not urinating as often as you usually do. SEEK IMMEDIATE MEDICAL CARE IF:  You pain is severe and not relieved with medicines.  You are unable to hold down any fluids.  You or someone else notices a change in your mental function.  You have a rapid heartbeat at rest.  You have shaking or chills.  You feel extremely weak.   This information is not intended to replace advice given to you by your health care provider. Make sure you discuss any questions you have with your health care provider.   Document Released: 08/18/2004 Document Revised: 12/11/2014 Document Reviewed: 07/16/2014 Elsevier Interactive Patient Education [employer].

## 2016-05-29 NOTE — ED Notes (Signed)
Patient complains of burning, stinking and pain with urination. She states that she has been drinking water and cranberry juice. Voiding frequently.

## 2016-06-20 DIAGNOSIS — H401131 Primary open-angle glaucoma, bilateral, mild stage: Secondary | ICD-10-CM | POA: Diagnosis not present

## 2016-06-20 DIAGNOSIS — H35371 Puckering of macula, right eye: Secondary | ICD-10-CM | POA: Diagnosis not present

## 2016-06-23 ENCOUNTER — Ambulatory Visit
Admission: EM | Admit: 2016-06-23 | Discharge: 2016-06-23 | Disposition: A | Discharge status: Home / Self Care | Payer: Medicare Other | Attending: Family Medicine | Admitting: Family Medicine

## 2016-06-23 DIAGNOSIS — K529 Noninfective gastroenteritis and colitis, unspecified: Secondary | ICD-10-CM

## 2016-06-23 MED ORDER — METRONIDAZOLE 500 MG PO TABS: 500.0000 mg | ORAL_TABLET | Freq: Two times a day (BID) | ORAL | Status: DC

## 2016-06-23 NOTE — Discharge Instructions (Signed)
Chronic Diarrhea Diarrhea is frequent loose and watery bowel movements. It can cause you to feel weak and dehydrated. Dehydration can cause you to become tired and thirsty and to have a dry mouth, decreased urination, and dark yellow urine. Diarrhea is a sign of another problem, most often an infection that will not last long. In most cases, diarrhea lasts 2-3 days. Diarrhea that lasts longer than 4 weeks is called long-lasting (chronic) diarrhea. It is important to treat your diarrhea as directed by your health care provider to lessen or prevent future episodes of diarrhea.  CAUSES  There are many causes of chronic diarrhea. The following are some possible causes:   Gastrointestinal infections caused by viruses, bacteria, or parasites.   Food poisoning or food allergies.   Certain medicines, such as antibiotics, chemotherapy, and laxatives.   Artificial sweeteners and fructose.   Digestive disorders, such as celiac disease and inflammatory bowel diseases.   Irritable bowel syndrome.  Some disorders of the pancreas.  Disorders of the thyroid.  Reduced blood flow to the intestines.  Cancer. Sometimes the cause of chronic diarrhea is unknown. RISK FACTORS  Having a severely weakened immune system, such as from HIV or AIDS.   Taking certain types of cancer-fighting drugs (such as with chemotherapy) or other medicines.   Having had a recent organ transplant.   Having a portion of the stomach or small bowel removed.   Traveling to countries where food and water supplies are often contaminated.  SYMPTOMS  In addition to frequent, loose stools, diarrhea may cause:   Cramping.   Abdominal pain.   Nausea.   Fever.  Fatigue.  Urgent need to use the bathroom.  Loss of bowel control. DIAGNOSIS  Your health care provider must take a careful history and perform a physical exam. Tests given are based on your symptoms and history. Tests may include:   Blood or  stool tests. Three or more stool samples may be examined. Stool cultures may be used to test for bacteria or parasites.   X-rays.   A procedure in which a thin tube is inserted into the mouth or rectum (endoscopy). This allows the health care provider to look inside the intestine.  TREATMENT   Treatment is aimed at correcting the cause of the diarrhea when possible.  Diarrhea caused by an infection can often be treated with antibiotic medicines.  Diarrhea not caused by an infection may require you to take long-term medicine or have surgery. Specific treatment should be discussed with your health care provider.  If the cause cannot be determined, treatment aims to relieve symptoms and prevent dehydration. Serious health problems can occur if you do not maintain proper fluid levels. Treatment may include:  Taking an oral rehydration solution (ORS).  Not drinking beverages that contain caffeine (such as tea, coffee, and soft drinks).  Not drinking alcohol.  Maintaining well-balanced nutrition to help you recover faster. HOME CARE INSTRUCTIONS   Drink enough fluids to keep urine clear or pale yellow. Drink 1 cup (8 oz) of fluid for each diarrhea episode. Avoid fluids that contain simple sugars, fruit juices, whole milk products, and sodas. Hydrate with an ORS. You may purchase the ORS or prepare it at home by mixing the following ingredients together:   - tsp (1.7-3  mL) table salt.   tsp (3  mL) baking soda.   tsp (1.7 mL) salt substitute containing potassium chloride.  1 tbsp (20 mL) sugar.  4.2 c (1 L) of water.     Certain foods and beverages may increase the speed at which food moves through the gastrointestinal (GI) tract. These foods and beverages should be avoided. They include:  Caffeinated and alcoholic beverages.  High-fiber foods, such as raw fruits and vegetables, nuts, seeds, and whole grain breads and cereals.  Foods and beverages sweetened with sugar  alcohols, such as xylitol, sorbitol, and mannitol.   Some foods may be well tolerated and may help thicken stool. These include:  Starchy foods, such as rice, toast, pasta, low-sugar cereal, oatmeal, grits, baked potatoes, crackers, and bagels.  Bananas.  Applesauce.  Add probiotic-rich foods to help increase healthy bacteria in the GI tract. These include yogurt and fermented milk products.  Wash your hands well after each diarrhea episode.  Only take over-the-counter or prescription medicines as directed by your health care provider.  Take a warm bath to relieve any burning or pain from frequent diarrhea episodes. SEEK MEDICAL CARE IF:   You are not urinating as often.  Your urine is a dark color.  You become very tired or dizzy.  You have severe pain in the abdomen or rectum.  Your have blood or pus in your stools.  Your stools look black and tarry. SEEK IMMEDIATE MEDICAL CARE IF:   You are unable to keep fluids down.  You have persistent vomiting.  You have blood in your stool.  Your stools are black and tarry.  You do not urinate in 6-8 hours, or there is only a small amount of very dark urine.  You have abdominal pain that increases or localizes.  You have weakness, dizziness, confusion, or lightheadedness.  You have a severe headache.  Your diarrhea gets worse or does not get better.  You have a fever or persistent symptoms for more than 2-3 days.  You have a fever and your symptoms suddenly get worse. MAKE SURE YOU:   Understand these instructions.  Will watch your condition.  Will get help right away if you are not doing well or get worse.   This information is not intended to replace advice given to you by your health care provider. Make sure you discuss any questions you have with your health care provider.   Document Released: 02/10/2004 Document Revised: 11/25/2013 Document Reviewed: 05/15/2013 Elsevier Interactive Patient Education 2016  Elsevier Inc[employer]  

## 2016-06-23 NOTE — ED Notes (Signed)
Patient states that she has been having diarrhea without warning. Patient states that she has been having runny diarrhea for over 3 weeks. Patient states that her son has Giardia and so does his puppies. Patient states that she has been around puppies and son a lot and believes she may have contracted it.

## 2016-06-23 NOTE — ED Provider Notes (Signed)
CSN: [id number]     Arrival date & time 06/23/16  1407 History   None    Chief Complaint  Patient presents with  . Diarrhea   (Consider location/radiation/quality/duration/timing/severity/associated sxs/prior Treatment) HPI Comments: 80 yo female with a c/o watery diarrhea for the past 3 weeks. Denies any abdominal pains, fevers, chills, vomiting, melena, hematochezia. States her son has had Giardia recently.   The history is provided by the patient.    Past Medical History  Diagnosis Date  . Hypertension   . Heart murmur   . Mitral valve disorder     prolaspe  . GERD (gastroesophageal reflux disease)   . History of stomach ulcers   . IBS (irritable bowel syndrome)   . Depression   . HOH (hard of hearing)     aids  . Hypothyroidism   . Back pain, chronic   . Sleep apnea     cpap -doesn't use  . Shortness of breath dyspnea     doe  . COPD (chronic obstructive pulmonary disease) ([place])     in past, no issues currently  . Environmental and seasonal allergies   . Hypercholesteremia   . Osteoporosis   . PTSD (post-traumatic stress disorder)   . Left leg weakness     s/p back surgeries  . Motion sickness     cars, boats  . Occasional tremors   . History of hiatal hernia    Past Surgical History  Procedure Laterality Date  . Abdominal hysterectomy    . Nasal septum surgery    . Tonsillectomy    . Mastoidectomy    . Pars plana vitrectomy Left 04/14/2015    Procedure: PARS PLANA VITRECTOMY WITH 25 GAUGE, endolaser, sf6 gas exchange left eye;  Surgeon: Milus Height, MD;  Location: [facility];  Service: Ophthalmology;  Laterality: Left;  [facility] Kitchen Eye surgery      left detach retina  . Cataract extraction w/phaco Left 08/25/2015    Procedure: CATARACT EXTRACTION PHACO AND INTRAOCULAR LENS PLACEMENT (IOC) WITH IOL COMPLICATED;  Surgeon: Leandrew Koyanagi, MD;  Location: [place];  Service: Ophthalmology;  Laterality: Left;  IVA BLOCK CPAP  . Back surgery      times 3   . Excision of skin tag      leisions  . Pars plana vitrectomy Left 01/12/2016    Procedure: PARS PLANA VITRECTOMY WITH 25 GAUGE;  Surgeon: Milus Height, MD;  Location: [facility];  Service: Ophthalmology;  Laterality: Left;  SF6:  14% casette lot [id number] H exp  05/2017  . Membrane peel Left 01/12/2016    Procedure: MEMBRANE PEEL;  Surgeon: Milus Height, MD;  Location: [facility];  Service: Ophthalmology;  Laterality: Left;   History reviewed. No pertinent family history. Social History  Substance Use Topics  . Smoking status: Never Smoker   . Smokeless tobacco: None  . Alcohol Use: No   OB History    No data available     Review of Systems  Allergies  Other; Pumpkin seed oil; Shellfish allergy; Shellfish-derived products; Spinach; and Sulfa antibiotics  Home Medications   Prior to Admission medications   Medication Sig Start Date End Date Taking? Authorizing Provider  albuterol (PROVENTIL HFA;VENTOLIN HFA) 108 (90 BASE) MCG/ACT inhaler Inhale 1-2 puffs into the lungs every 6 (six) hours as needed for wheezing or shortness of breath. 06/04/15  Yes Paulina Fusi, MD  aspirin 81 MG tablet Take by mouth daily.   Yes Historical Provider, MD  atorvastatin (LIPITOR) 40 MG tablet Take  40 mg by mouth daily. PM   Yes Historical Provider, MD  buPROPion (WELLBUTRIN) 100 MG tablet Take 150 mg by mouth 2 (two) times daily.    Yes Historical Provider, MD  calcium-vitamin D (OSCAL WITH D) 500-200 MG-UNIT tablet Take 1 tablet by mouth.   Yes Historical Provider, MD  clonazePAM (KLONOPIN) 0.5 MG tablet Take 0.5 mg by mouth every morning.    Yes Historical Provider, MD  Difluprednate (DUREZOL) 0.05 % EMUL Apply 1 drop to eye at bedtime. Reported on 01/12/2016   Yes Historical Provider, MD  donepezil (ARICEPT) 10 MG tablet Take 10 mg by mouth at bedtime. AM   Yes Historical Provider, MD  esomeprazole (NEXIUM) 40 MG packet Take 40 mg by mouth daily before breakfast.   Yes Historical Provider, MD   felodipine (PLENDIL) 2.5 MG 24 hr tablet Take 2.5 mg by mouth at bedtime. PM   Yes Historical Provider, MD  fluticasone (FLONASE) 50 MCG/ACT nasal spray Place 1 spray into both nostrils every morning.   Yes Historical Provider, MD  gabapentin (NEURONTIN) 100 MG capsule Take 100 mg by mouth 2 (two) times daily.    Yes Historical Provider, MD  hydrochlorothiazide (MICROZIDE) 12.5 MG capsule Take by mouth daily.   Yes Historical Provider, MD  levothyroxine (SYNTHROID, LEVOTHROID) 88 MCG tablet Take 88 mcg by mouth daily before breakfast.   Yes Historical Provider, MD  lisinopril (PRINIVIL,ZESTRIL) 30 MG tablet Take 30 mg by mouth at bedtime.   Yes Historical Provider, MD  metFORMIN (GLUCOPHAGE) 500 MG tablet Take 500 mg by mouth 2 (two) times daily with a meal.   Yes Historical Provider, MD  Multiple Vitamin (MULTIVITAMIN) capsule Take 1 capsule by mouth daily.   Yes Historical Provider, MD  Multiple Vitamins-Minerals (PRESERVISION AREDS 2 PO) Take 1 tablet by mouth 2 (two) times daily.    Yes Historical Provider, MD  sertraline (ZOLOFT) 100 MG tablet Take 100 mg by mouth 2 (two) times daily.    Yes Historical Provider, MD  traMADol (ULTRAM) 50 MG tablet Take by mouth every 4 (four) hours as needed.   Yes Historical Provider, MD  Travoprost, BAK Free, (TRAVATAN) 0.004 % SOLN ophthalmic solution    Yes Historical Provider, MD  triamterene-hydrochlorothiazide (MAXZIDE) 75-50 MG per tablet Take 0.5 tablets by mouth daily.   Yes Historical Provider, MD  vitamin E 100 UNIT capsule Take by mouth daily.   Yes Historical Provider, MD  cefUROXime (CEFTIN) 500 MG tablet Take 1 tablet (500 mg total) by mouth 2 (two) times daily. 04/24/16   Frederich Cha, MD  cephALEXin (KEFLEX) 500 MG capsule Take 1 capsule (500 mg total) by mouth 2 (two) times daily. For 7 days 05/29/16   Marylene Land, NP  HYDROcodone-acetaminophen (NORCO/VICODIN) 5-325 MG per tablet Take 1 tablet by mouth every 6 (six) hours as needed for  moderate pain.    Historical Provider, MD  metroNIDAZOLE (FLAGYL) 500 MG tablet Take 1 tablet (500 mg total) by mouth 2 (two) times daily. 06/23/16   Norval Gable, MD  nepafenac (ILEVRO) 0.3 % ophthalmic suspension 1 drop at bedtime. Reported on 01/12/2016    Historical Provider, MD  nitrofurantoin, macrocrystal-monohydrate, (MACROBID) 100 MG capsule Take 1 capsule (100 mg total) by mouth 2 (two) times daily. 05/03/16   Paulina Fusi, MD  phenazopyridine (PYRIDIUM) 200 MG tablet Take 1 tablet (200 mg total) by mouth 3 (three) times daily as needed for pain. 04/24/16   Frederich Cha, MD  simvastatin (ZOCOR) 20 MG tablet Take  20 mg by mouth daily at 6 PM.    Historical Provider, MD   Meds Ordered and Administered this Visit  Medications - No data to display  BP 155/67 mmHg  Pulse 83  Temp(Src) 98.3 F (36.8 C) (Oral)  Resp 15  Ht 5' (1.524 m)  Wt 117 lb (53.071 kg)  BMI 22.85 kg/m2  SpO2 98% No data found.   Physical Exam  Constitutional: She appears well-developed.  Cardiovascular: Normal rate, regular rhythm, normal heart sounds and intact distal pulses.   No murmur heard. Pulmonary/Chest: Effort normal and breath sounds normal. No respiratory distress. She has no wheezes. She has no rales.  Abdominal: Soft. Bowel sounds are normal. She exhibits no distension and no mass. There is no tenderness. There is no rebound and no guarding.  Neurological: She is alert.  Skin: Skin is warm and dry. No rash noted. She is not diaphoretic. No erythema.  Nursing note and vitals reviewed.   ED Course  Procedures (including critical care time)  Labs Review Labs Reviewed  GASTROINTESTINAL PANEL BY PCR, STOOL (REPLACES STOOL CULTURE)    Imaging Review No results found.   Visual Acuity Review  Right Eye Distance:   Left Eye Distance:   Bilateral Distance:    Right Eye Near:   Left Eye Near:    Bilateral Near:         MDM   1. Chronic diarrhea    Discharge Medication List as of  06/23/2016  4:06 PM    START taking these medications   Details  metroNIDAZOLE (FLAGYL) 500 MG tablet Take 1 tablet (500 mg total) by mouth 2 (two) times daily., Starting 06/23/2016, Until Discontinued, Normal       1. Possible diagnosis reviewed with patient; patient unable to give stool sample while in clinic; given containers to obtain stool sample at home and bring back 2. rx as per orders above; reviewed possible side effects, interactions, risks and benefits (empiric tx) 3. Recommend supportive treatment with increased fluids 4. Follow-up prn if symptoms worsen or don't improve    Norval Gable, MD 06/23/16 2013

## 2016-06-25 ENCOUNTER — Telehealth (INDEPENDENT_AMBULATORY_CARE_PROVIDER_SITE_OTHER): Payer: Medicare Other | Admitting: Family Medicine

## 2016-06-25 ENCOUNTER — Other Ambulatory Visit
Admission: RE | Admit: 2016-06-25 | Discharge: 2016-06-25 | Disposition: A | Discharge status: Home / Self Care | Payer: Medicare Other | Source: Ambulatory Visit | Attending: Family Medicine | Admitting: Family Medicine

## 2016-06-25 DIAGNOSIS — K529 Noninfective gastroenteritis and colitis, unspecified: Secondary | ICD-10-CM

## 2016-06-25 NOTE — Telephone Encounter (Signed)
Brought stool sample today

## 2016-06-26 ENCOUNTER — Other Ambulatory Visit: Payer: Self-pay | Admitting: *Deleted

## 2016-06-26 MED ORDER — CEFTIN 250 MG PO TABS: 250.0000 mg | ORAL_TABLET | Freq: Two times a day (BID) | ORAL | 0 refills | Status: AC

## 2016-06-26 NOTE — Telephone Encounter (Signed)
Unable to contact patient at this time. Prescription for Ceftin 250 mg BID for 7 days sent to pharmacy.

## 2016-06-27 ENCOUNTER — Telehealth: Payer: Self-pay | Admitting: *Deleted

## 2016-06-27 LAB — GASTROINTESTINAL PANEL BY PCR, STOOL (REPLACES STOOL CULTURE)
ASTROVIRUS: NOT DETECTED
Adenovirus F40/41: NOT DETECTED
CYCLOSPORA CAYETANENSIS: NOT DETECTED
Campylobacter species: NOT DETECTED
Cryptosporidium: NOT DETECTED
E. COLI O157: NOT DETECTED
ENTAMOEBA HISTOLYTICA: NOT DETECTED
ENTEROAGGREGATIVE E COLI (EAEC): DETECTED — AB
ENTEROTOXIGENIC E COLI (ETEC): NOT DETECTED
Enteropathogenic E coli (EPEC): NOT DETECTED
GIARDIA LAMBLIA: NOT DETECTED
Norovirus GI/GII: NOT DETECTED
Plesimonas shigelloides: NOT DETECTED
Rotavirus A: NOT DETECTED
SALMONELLA SPECIES: NOT DETECTED
SAPOVIRUS (I, II, IV, AND V): NOT DETECTED
SHIGA LIKE TOXIN PRODUCING E COLI (STEC): NOT DETECTED
Shigella/Enteroinvasive E coli (EIEC): NOT DETECTED
VIBRIO CHOLERAE: NOT DETECTED
VIBRIO SPECIES: NOT DETECTED
Yersinia enterocolitica: NOT DETECTED

## 2016-06-27 NOTE — Telephone Encounter (Signed)
Called patient and informed her that she tested positive for E Coli and that a prescription for Ceftin is available for pick up at her pharmacy on record. Patient confirmed understanding of information and direction.

## 2016-08-25 DIAGNOSIS — R300 Dysuria: Secondary | ICD-10-CM | POA: Diagnosis not present

## 2016-08-25 DIAGNOSIS — N390 Urinary tract infection, site not specified: Secondary | ICD-10-CM | POA: Diagnosis not present

## 2016-09-08 DIAGNOSIS — R319 Hematuria, unspecified: Secondary | ICD-10-CM | POA: Diagnosis not present

## 2016-09-08 DIAGNOSIS — R300 Dysuria: Secondary | ICD-10-CM | POA: Diagnosis not present

## 2016-09-08 DIAGNOSIS — R809 Proteinuria, unspecified: Secondary | ICD-10-CM | POA: Diagnosis not present

## 2016-09-08 DIAGNOSIS — N390 Urinary tract infection, site not specified: Secondary | ICD-10-CM | POA: Diagnosis not present

## 2016-09-11 DIAGNOSIS — L814 Other melanin hyperpigmentation: Secondary | ICD-10-CM | POA: Diagnosis not present

## 2016-09-11 DIAGNOSIS — L872 Elastosis perforans serpiginosa: Secondary | ICD-10-CM | POA: Diagnosis not present

## 2016-09-19 DIAGNOSIS — E119 Type 2 diabetes mellitus without complications: Secondary | ICD-10-CM | POA: Diagnosis not present

## 2016-09-19 DIAGNOSIS — H35341 Macular cyst, hole, or pseudohole, right eye: Secondary | ICD-10-CM | POA: Diagnosis not present

## 2016-09-19 DIAGNOSIS — H401132 Primary open-angle glaucoma, bilateral, moderate stage: Secondary | ICD-10-CM | POA: Diagnosis not present

## 2016-09-19 DIAGNOSIS — H25811 Combined forms of age-related cataract, right eye: Secondary | ICD-10-CM | POA: Diagnosis not present

## 2016-09-19 DIAGNOSIS — H35373 Puckering of macula, bilateral: Secondary | ICD-10-CM | POA: Diagnosis not present

## 2016-09-20 DIAGNOSIS — N390 Urinary tract infection, site not specified: Secondary | ICD-10-CM | POA: Diagnosis not present

## 2016-09-21 DIAGNOSIS — E039 Hypothyroidism, unspecified: Secondary | ICD-10-CM | POA: Diagnosis not present

## 2016-09-21 DIAGNOSIS — F419 Anxiety disorder, unspecified: Secondary | ICD-10-CM | POA: Diagnosis not present

## 2016-09-21 DIAGNOSIS — K219 Gastro-esophageal reflux disease without esophagitis: Secondary | ICD-10-CM | POA: Diagnosis not present

## 2016-09-21 DIAGNOSIS — E119 Type 2 diabetes mellitus without complications: Secondary | ICD-10-CM | POA: Diagnosis not present

## 2016-09-21 DIAGNOSIS — F0390 Unspecified dementia without behavioral disturbance: Secondary | ICD-10-CM | POA: Diagnosis not present

## 2016-09-21 DIAGNOSIS — S0083XA Contusion of other part of head, initial encounter: Secondary | ICD-10-CM | POA: Diagnosis not present

## 2016-09-21 DIAGNOSIS — I10 Essential (primary) hypertension: Secondary | ICD-10-CM | POA: Diagnosis not present

## 2016-09-21 DIAGNOSIS — E785 Hyperlipidemia, unspecified: Secondary | ICD-10-CM | POA: Diagnosis not present

## 2016-09-21 DIAGNOSIS — F329 Major depressive disorder, single episode, unspecified: Secondary | ICD-10-CM | POA: Diagnosis not present

## 2016-09-28 DIAGNOSIS — N390 Urinary tract infection, site not specified: Secondary | ICD-10-CM | POA: Diagnosis not present

## 2016-09-28 DIAGNOSIS — K5730 Diverticulosis of large intestine without perforation or abscess without bleeding: Secondary | ICD-10-CM | POA: Diagnosis not present

## 2016-09-28 DIAGNOSIS — M5136 Other intervertebral disc degeneration, lumbar region: Secondary | ICD-10-CM | POA: Diagnosis not present

## 2016-09-28 DIAGNOSIS — R933 Abnormal findings on diagnostic imaging of other parts of digestive tract: Secondary | ICD-10-CM | POA: Diagnosis not present

## 2016-09-28 DIAGNOSIS — K429 Umbilical hernia without obstruction or gangrene: Secondary | ICD-10-CM | POA: Diagnosis not present

## 2016-09-28 DIAGNOSIS — I70208 Unspecified atherosclerosis of native arteries of extremities, other extremity: Secondary | ICD-10-CM | POA: Diagnosis not present

## 2016-10-10 DIAGNOSIS — F419 Anxiety disorder, unspecified: Secondary | ICD-10-CM | POA: Diagnosis not present

## 2016-10-10 DIAGNOSIS — R7301 Impaired fasting glucose: Secondary | ICD-10-CM | POA: Diagnosis not present

## 2016-10-10 DIAGNOSIS — R296 Repeated falls: Secondary | ICD-10-CM | POA: Diagnosis not present

## 2016-10-10 DIAGNOSIS — E039 Hypothyroidism, unspecified: Secondary | ICD-10-CM | POA: Diagnosis not present

## 2016-10-10 DIAGNOSIS — Z1389 Encounter for screening for other disorder: Secondary | ICD-10-CM | POA: Diagnosis not present

## 2016-10-10 DIAGNOSIS — I10 Essential (primary) hypertension: Secondary | ICD-10-CM | POA: Diagnosis not present

## 2016-10-10 DIAGNOSIS — F329 Major depressive disorder, single episode, unspecified: Secondary | ICD-10-CM | POA: Diagnosis not present

## 2016-10-10 DIAGNOSIS — M545 Low back pain: Secondary | ICD-10-CM | POA: Diagnosis not present

## 2016-10-10 DIAGNOSIS — G4700 Insomnia, unspecified: Secondary | ICD-10-CM | POA: Diagnosis not present

## 2016-10-10 DIAGNOSIS — R413 Other amnesia: Secondary | ICD-10-CM | POA: Diagnosis not present

## 2016-10-12 DIAGNOSIS — D489 Neoplasm of uncertain behavior, unspecified: Secondary | ICD-10-CM | POA: Diagnosis not present

## 2016-10-12 DIAGNOSIS — D376 Neoplasm of uncertain behavior of liver, gallbladder and bile ducts: Secondary | ICD-10-CM | POA: Diagnosis not present

## 2016-10-12 DIAGNOSIS — D490 Neoplasm of unspecified behavior of digestive system: Secondary | ICD-10-CM | POA: Diagnosis not present

## 2016-10-16 DIAGNOSIS — H35341 Macular cyst, hole, or pseudohole, right eye: Secondary | ICD-10-CM | POA: Diagnosis not present

## 2016-10-16 DIAGNOSIS — H35371 Puckering of macula, right eye: Secondary | ICD-10-CM | POA: Diagnosis not present

## 2016-10-16 DIAGNOSIS — H2511 Age-related nuclear cataract, right eye: Secondary | ICD-10-CM | POA: Diagnosis not present

## 2016-10-16 DIAGNOSIS — H3581 Retinal edema: Secondary | ICD-10-CM | POA: Diagnosis not present

## 2016-10-23 DIAGNOSIS — K869 Disease of pancreas, unspecified: Secondary | ICD-10-CM | POA: Diagnosis not present

## 2016-10-24 DIAGNOSIS — D490 Neoplasm of unspecified behavior of digestive system: Secondary | ICD-10-CM | POA: Diagnosis not present

## 2016-10-24 DIAGNOSIS — R339 Retention of urine, unspecified: Secondary | ICD-10-CM | POA: Diagnosis not present

## 2016-10-24 DIAGNOSIS — N390 Urinary tract infection, site not specified: Secondary | ICD-10-CM | POA: Diagnosis not present

## 2016-10-24 DIAGNOSIS — R3914 Feeling of incomplete bladder emptying: Secondary | ICD-10-CM | POA: Diagnosis not present

## 2016-10-24 DIAGNOSIS — H43811 Vitreous degeneration, right eye: Secondary | ICD-10-CM | POA: Diagnosis not present

## 2016-10-24 DIAGNOSIS — H2511 Age-related nuclear cataract, right eye: Secondary | ICD-10-CM | POA: Diagnosis not present

## 2016-10-25 DIAGNOSIS — K869 Disease of pancreas, unspecified: Secondary | ICD-10-CM | POA: Diagnosis not present

## 2016-10-25 DIAGNOSIS — Z981 Arthrodesis status: Secondary | ICD-10-CM | POA: Diagnosis not present

## 2016-10-31 DIAGNOSIS — K8689 Other specified diseases of pancreas: Secondary | ICD-10-CM | POA: Diagnosis not present

## 2016-10-31 DIAGNOSIS — R933 Abnormal findings on diagnostic imaging of other parts of digestive tract: Secondary | ICD-10-CM | POA: Diagnosis not present

## 2016-10-31 DIAGNOSIS — C250 Malignant neoplasm of head of pancreas: Secondary | ICD-10-CM | POA: Diagnosis not present

## 2016-10-31 DIAGNOSIS — Z87440 Personal history of urinary (tract) infections: Secondary | ICD-10-CM | POA: Diagnosis not present

## 2016-10-31 DIAGNOSIS — F0390 Unspecified dementia without behavioral disturbance: Secondary | ICD-10-CM | POA: Diagnosis not present

## 2016-10-31 DIAGNOSIS — Z7984 Long term (current) use of oral hypoglycemic drugs: Secondary | ICD-10-CM | POA: Diagnosis not present

## 2016-10-31 DIAGNOSIS — J449 Chronic obstructive pulmonary disease, unspecified: Secondary | ICD-10-CM | POA: Diagnosis not present

## 2016-10-31 DIAGNOSIS — R634 Abnormal weight loss: Secondary | ICD-10-CM | POA: Diagnosis not present

## 2016-10-31 DIAGNOSIS — G4730 Sleep apnea, unspecified: Secondary | ICD-10-CM | POA: Diagnosis not present

## 2016-10-31 DIAGNOSIS — E119 Type 2 diabetes mellitus without complications: Secondary | ICD-10-CM | POA: Diagnosis not present

## 2016-11-06 DIAGNOSIS — C259 Malignant neoplasm of pancreas, unspecified: Secondary | ICD-10-CM | POA: Diagnosis not present

## 2016-11-07 DIAGNOSIS — E46 Unspecified protein-calorie malnutrition: Secondary | ICD-10-CM | POA: Diagnosis not present

## 2016-11-07 DIAGNOSIS — C259 Malignant neoplasm of pancreas, unspecified: Secondary | ICD-10-CM | POA: Diagnosis not present

## 2016-11-07 DIAGNOSIS — G893 Neoplasm related pain (acute) (chronic): Secondary | ICD-10-CM | POA: Diagnosis not present

## 2016-11-09 DIAGNOSIS — C259 Malignant neoplasm of pancreas, unspecified: Secondary | ICD-10-CM | POA: Diagnosis not present

## 2016-11-09 DIAGNOSIS — Z7189 Other specified counseling: Secondary | ICD-10-CM | POA: Diagnosis not present

## 2016-11-10 DIAGNOSIS — N390 Urinary tract infection, site not specified: Secondary | ICD-10-CM | POA: Diagnosis not present

## 2016-11-10 DIAGNOSIS — Z23 Encounter for immunization: Secondary | ICD-10-CM | POA: Diagnosis not present

## 2016-11-10 DIAGNOSIS — F419 Anxiety disorder, unspecified: Secondary | ICD-10-CM | POA: Diagnosis not present

## 2016-11-10 DIAGNOSIS — M545 Low back pain: Secondary | ICD-10-CM | POA: Diagnosis not present

## 2016-11-15 DIAGNOSIS — R3914 Feeling of incomplete bladder emptying: Secondary | ICD-10-CM | POA: Diagnosis not present

## 2016-11-15 DIAGNOSIS — N390 Urinary tract infection, site not specified: Secondary | ICD-10-CM | POA: Diagnosis not present

## 2016-11-16 DIAGNOSIS — E46 Unspecified protein-calorie malnutrition: Secondary | ICD-10-CM | POA: Diagnosis not present

## 2016-11-16 DIAGNOSIS — G893 Neoplasm related pain (acute) (chronic): Secondary | ICD-10-CM | POA: Diagnosis not present

## 2016-11-16 DIAGNOSIS — C259 Malignant neoplasm of pancreas, unspecified: Secondary | ICD-10-CM | POA: Diagnosis not present

## 2016-11-21 DIAGNOSIS — D649 Anemia, unspecified: Secondary | ICD-10-CM | POA: Diagnosis not present

## 2016-11-21 DIAGNOSIS — E860 Dehydration: Secondary | ICD-10-CM | POA: Diagnosis not present

## 2016-11-21 DIAGNOSIS — C259 Malignant neoplasm of pancreas, unspecified: Secondary | ICD-10-CM | POA: Diagnosis not present

## 2016-11-21 DIAGNOSIS — R110 Nausea: Secondary | ICD-10-CM | POA: Diagnosis not present

## 2016-11-21 DIAGNOSIS — R197 Diarrhea, unspecified: Secondary | ICD-10-CM | POA: Diagnosis not present

## 2016-11-21 DIAGNOSIS — E876 Hypokalemia: Secondary | ICD-10-CM | POA: Diagnosis not present

## 2016-11-21 DIAGNOSIS — Z79899 Other long term (current) drug therapy: Secondary | ICD-10-CM | POA: Diagnosis not present

## 2016-11-21 DIAGNOSIS — I10 Essential (primary) hypertension: Secondary | ICD-10-CM | POA: Diagnosis not present

## 2016-11-21 DIAGNOSIS — D630 Anemia in neoplastic disease: Secondary | ICD-10-CM | POA: Diagnosis not present

## 2016-11-23 DIAGNOSIS — E876 Hypokalemia: Secondary | ICD-10-CM | POA: Diagnosis not present

## 2016-11-23 DIAGNOSIS — D649 Anemia, unspecified: Secondary | ICD-10-CM | POA: Diagnosis not present

## 2016-11-23 DIAGNOSIS — C259 Malignant neoplasm of pancreas, unspecified: Secondary | ICD-10-CM | POA: Diagnosis not present

## 2016-11-28 DIAGNOSIS — R339 Retention of urine, unspecified: Secondary | ICD-10-CM | POA: Diagnosis not present

## 2016-11-28 DIAGNOSIS — C259 Malignant neoplasm of pancreas, unspecified: Secondary | ICD-10-CM | POA: Diagnosis not present

## 2016-11-29 DIAGNOSIS — D649 Anemia, unspecified: Secondary | ICD-10-CM | POA: Diagnosis not present

## 2016-12-06 DIAGNOSIS — R197 Diarrhea, unspecified: Secondary | ICD-10-CM | POA: Diagnosis not present

## 2016-12-06 DIAGNOSIS — D649 Anemia, unspecified: Secondary | ICD-10-CM | POA: Diagnosis not present

## 2016-12-06 DIAGNOSIS — R630 Anorexia: Secondary | ICD-10-CM | POA: Diagnosis not present

## 2016-12-06 DIAGNOSIS — C259 Malignant neoplasm of pancreas, unspecified: Secondary | ICD-10-CM | POA: Diagnosis not present

## 2016-12-12 DIAGNOSIS — E119 Type 2 diabetes mellitus without complications: Secondary | ICD-10-CM | POA: Diagnosis not present

## 2016-12-12 DIAGNOSIS — Z7984 Long term (current) use of oral hypoglycemic drugs: Secondary | ICD-10-CM | POA: Diagnosis not present

## 2016-12-12 DIAGNOSIS — F419 Anxiety disorder, unspecified: Secondary | ICD-10-CM | POA: Diagnosis not present

## 2016-12-12 DIAGNOSIS — Z79899 Other long term (current) drug therapy: Secondary | ICD-10-CM | POA: Diagnosis not present

## 2016-12-12 DIAGNOSIS — H401130 Primary open-angle glaucoma, bilateral, stage unspecified: Secondary | ICD-10-CM | POA: Diagnosis not present

## 2016-12-12 DIAGNOSIS — E079 Disorder of thyroid, unspecified: Secondary | ICD-10-CM | POA: Diagnosis not present

## 2016-12-12 DIAGNOSIS — H401110 Primary open-angle glaucoma, right eye, stage unspecified: Secondary | ICD-10-CM | POA: Diagnosis not present

## 2016-12-12 DIAGNOSIS — I10 Essential (primary) hypertension: Secondary | ICD-10-CM | POA: Diagnosis not present

## 2016-12-12 DIAGNOSIS — H2511 Age-related nuclear cataract, right eye: Secondary | ICD-10-CM | POA: Diagnosis not present

## 2016-12-13 DIAGNOSIS — Z5111 Encounter for antineoplastic chemotherapy: Secondary | ICD-10-CM | POA: Diagnosis not present

## 2016-12-13 DIAGNOSIS — C259 Malignant neoplasm of pancreas, unspecified: Secondary | ICD-10-CM | POA: Diagnosis not present

## 2016-12-20 DIAGNOSIS — C259 Malignant neoplasm of pancreas, unspecified: Secondary | ICD-10-CM | POA: Diagnosis not present

## 2016-12-20 DIAGNOSIS — Z5111 Encounter for antineoplastic chemotherapy: Secondary | ICD-10-CM | POA: Diagnosis not present

## 2017-01-03 DIAGNOSIS — F419 Anxiety disorder, unspecified: Secondary | ICD-10-CM | POA: Diagnosis not present

## 2017-01-03 DIAGNOSIS — G893 Neoplasm related pain (acute) (chronic): Secondary | ICD-10-CM | POA: Diagnosis not present

## 2017-01-03 DIAGNOSIS — Z7189 Other specified counseling: Secondary | ICD-10-CM | POA: Diagnosis not present

## 2017-01-03 DIAGNOSIS — C259 Malignant neoplasm of pancreas, unspecified: Secondary | ICD-10-CM | POA: Diagnosis not present

## 2017-01-15 DIAGNOSIS — C259 Malignant neoplasm of pancreas, unspecified: Secondary | ICD-10-CM | POA: Diagnosis not present

## 2017-01-15 DIAGNOSIS — G893 Neoplasm related pain (acute) (chronic): Secondary | ICD-10-CM | POA: Diagnosis not present

## 2017-02-01 DIAGNOSIS — C259 Malignant neoplasm of pancreas, unspecified: Secondary | ICD-10-CM | POA: Diagnosis not present

## 2017-02-01 DIAGNOSIS — G893 Neoplasm related pain (acute) (chronic): Secondary | ICD-10-CM | POA: Diagnosis not present

## 2017-02-19 DIAGNOSIS — H26491 Other secondary cataract, right eye: Secondary | ICD-10-CM | POA: Diagnosis not present

## 2017-02-19 DIAGNOSIS — H47293 Other optic atrophy, bilateral: Secondary | ICD-10-CM | POA: Diagnosis not present

## 2017-02-26 DIAGNOSIS — H6123 Impacted cerumen, bilateral: Secondary | ICD-10-CM | POA: Diagnosis not present

## 2017-02-27 DIAGNOSIS — H401132 Primary open-angle glaucoma, bilateral, moderate stage: Secondary | ICD-10-CM | POA: Diagnosis not present

## 2017-02-27 DIAGNOSIS — H35373 Puckering of macula, bilateral: Secondary | ICD-10-CM | POA: Diagnosis not present

## 2017-03-04 DIAGNOSIS — C259 Malignant neoplasm of pancreas, unspecified: Secondary | ICD-10-CM | POA: Diagnosis not present

## 2017-03-04 DIAGNOSIS — G893 Neoplasm related pain (acute) (chronic): Secondary | ICD-10-CM | POA: Diagnosis not present

## 2017-03-22 DIAGNOSIS — C259 Malignant neoplasm of pancreas, unspecified: Secondary | ICD-10-CM | POA: Diagnosis not present

## 2017-03-22 DIAGNOSIS — G893 Neoplasm related pain (acute) (chronic): Secondary | ICD-10-CM | POA: Diagnosis not present

## 2017-03-22 DIAGNOSIS — F419 Anxiety disorder, unspecified: Secondary | ICD-10-CM | POA: Diagnosis not present

## 2017-03-23 DIAGNOSIS — F419 Anxiety disorder, unspecified: Secondary | ICD-10-CM | POA: Diagnosis not present

## 2017-03-23 DIAGNOSIS — C259 Malignant neoplasm of pancreas, unspecified: Secondary | ICD-10-CM | POA: Diagnosis not present

## 2017-03-23 DIAGNOSIS — G893 Neoplasm related pain (acute) (chronic): Secondary | ICD-10-CM | POA: Diagnosis not present

## 2017-03-26 DIAGNOSIS — C259 Malignant neoplasm of pancreas, unspecified: Secondary | ICD-10-CM | POA: Diagnosis not present

## 2017-03-26 DIAGNOSIS — F419 Anxiety disorder, unspecified: Secondary | ICD-10-CM | POA: Diagnosis not present

## 2017-03-26 DIAGNOSIS — G893 Neoplasm related pain (acute) (chronic): Secondary | ICD-10-CM | POA: Diagnosis not present

## 2017-03-27 DIAGNOSIS — C259 Malignant neoplasm of pancreas, unspecified: Secondary | ICD-10-CM | POA: Diagnosis not present

## 2017-03-27 DIAGNOSIS — F419 Anxiety disorder, unspecified: Secondary | ICD-10-CM | POA: Diagnosis not present

## 2017-03-27 DIAGNOSIS — G893 Neoplasm related pain (acute) (chronic): Secondary | ICD-10-CM | POA: Diagnosis not present

## 2017-03-28 DIAGNOSIS — C259 Malignant neoplasm of pancreas, unspecified: Secondary | ICD-10-CM | POA: Diagnosis not present

## 2017-03-28 DIAGNOSIS — G893 Neoplasm related pain (acute) (chronic): Secondary | ICD-10-CM | POA: Diagnosis not present

## 2017-03-28 DIAGNOSIS — F419 Anxiety disorder, unspecified: Secondary | ICD-10-CM | POA: Diagnosis not present

## 2017-04-03 DIAGNOSIS — C259 Malignant neoplasm of pancreas, unspecified: Secondary | ICD-10-CM | POA: Diagnosis not present

## 2017-04-03 DIAGNOSIS — G893 Neoplasm related pain (acute) (chronic): Secondary | ICD-10-CM | POA: Diagnosis not present

## 2017-04-03 DIAGNOSIS — F419 Anxiety disorder, unspecified: Secondary | ICD-10-CM | POA: Diagnosis not present

## 2017-04-04 DIAGNOSIS — G893 Neoplasm related pain (acute) (chronic): Secondary | ICD-10-CM | POA: Diagnosis not present

## 2017-04-04 DIAGNOSIS — C259 Malignant neoplasm of pancreas, unspecified: Secondary | ICD-10-CM | POA: Diagnosis not present

## 2017-04-04 DIAGNOSIS — F419 Anxiety disorder, unspecified: Secondary | ICD-10-CM | POA: Diagnosis not present

## 2017-04-06 DIAGNOSIS — G893 Neoplasm related pain (acute) (chronic): Secondary | ICD-10-CM | POA: Diagnosis not present

## 2017-04-06 DIAGNOSIS — C259 Malignant neoplasm of pancreas, unspecified: Secondary | ICD-10-CM | POA: Diagnosis not present

## 2017-04-06 DIAGNOSIS — F419 Anxiety disorder, unspecified: Secondary | ICD-10-CM | POA: Diagnosis not present

## 2017-04-09 DIAGNOSIS — G893 Neoplasm related pain (acute) (chronic): Secondary | ICD-10-CM | POA: Diagnosis not present

## 2017-04-09 DIAGNOSIS — C259 Malignant neoplasm of pancreas, unspecified: Secondary | ICD-10-CM | POA: Diagnosis not present

## 2017-04-09 DIAGNOSIS — F419 Anxiety disorder, unspecified: Secondary | ICD-10-CM | POA: Diagnosis not present

## 2017-04-12 DIAGNOSIS — G893 Neoplasm related pain (acute) (chronic): Secondary | ICD-10-CM | POA: Diagnosis not present

## 2017-04-12 DIAGNOSIS — C259 Malignant neoplasm of pancreas, unspecified: Secondary | ICD-10-CM | POA: Diagnosis not present

## 2017-04-12 DIAGNOSIS — F419 Anxiety disorder, unspecified: Secondary | ICD-10-CM | POA: Diagnosis not present

## 2017-04-17 DIAGNOSIS — F419 Anxiety disorder, unspecified: Secondary | ICD-10-CM | POA: Diagnosis not present

## 2017-04-17 DIAGNOSIS — C259 Malignant neoplasm of pancreas, unspecified: Secondary | ICD-10-CM | POA: Diagnosis not present

## 2017-04-17 DIAGNOSIS — G893 Neoplasm related pain (acute) (chronic): Secondary | ICD-10-CM | POA: Diagnosis not present

## 2017-04-20 DIAGNOSIS — F419 Anxiety disorder, unspecified: Secondary | ICD-10-CM | POA: Diagnosis not present

## 2017-04-20 DIAGNOSIS — G893 Neoplasm related pain (acute) (chronic): Secondary | ICD-10-CM | POA: Diagnosis not present

## 2017-04-20 DIAGNOSIS — C259 Malignant neoplasm of pancreas, unspecified: Secondary | ICD-10-CM | POA: Diagnosis not present

## 2017-04-23 DIAGNOSIS — F419 Anxiety disorder, unspecified: Secondary | ICD-10-CM | POA: Diagnosis not present

## 2017-04-23 DIAGNOSIS — C259 Malignant neoplasm of pancreas, unspecified: Secondary | ICD-10-CM | POA: Diagnosis not present

## 2017-04-23 DIAGNOSIS — G893 Neoplasm related pain (acute) (chronic): Secondary | ICD-10-CM | POA: Diagnosis not present

## 2017-04-24 DIAGNOSIS — G893 Neoplasm related pain (acute) (chronic): Secondary | ICD-10-CM | POA: Diagnosis not present

## 2017-04-24 DIAGNOSIS — F419 Anxiety disorder, unspecified: Secondary | ICD-10-CM | POA: Diagnosis not present

## 2017-04-24 DIAGNOSIS — C259 Malignant neoplasm of pancreas, unspecified: Secondary | ICD-10-CM | POA: Diagnosis not present

## 2017-04-27 DIAGNOSIS — F419 Anxiety disorder, unspecified: Secondary | ICD-10-CM | POA: Diagnosis not present

## 2017-04-27 DIAGNOSIS — C259 Malignant neoplasm of pancreas, unspecified: Secondary | ICD-10-CM | POA: Diagnosis not present

## 2017-04-27 DIAGNOSIS — G893 Neoplasm related pain (acute) (chronic): Secondary | ICD-10-CM | POA: Diagnosis not present

## 2017-05-01 DIAGNOSIS — F419 Anxiety disorder, unspecified: Secondary | ICD-10-CM | POA: Diagnosis not present

## 2017-05-01 DIAGNOSIS — C259 Malignant neoplasm of pancreas, unspecified: Secondary | ICD-10-CM | POA: Diagnosis not present

## 2017-05-01 DIAGNOSIS — G893 Neoplasm related pain (acute) (chronic): Secondary | ICD-10-CM | POA: Diagnosis not present

## 2017-05-04 DIAGNOSIS — C259 Malignant neoplasm of pancreas, unspecified: Secondary | ICD-10-CM | POA: Diagnosis not present

## 2017-05-04 DIAGNOSIS — F419 Anxiety disorder, unspecified: Secondary | ICD-10-CM | POA: Diagnosis not present

## 2017-05-04 DIAGNOSIS — G893 Neoplasm related pain (acute) (chronic): Secondary | ICD-10-CM | POA: Diagnosis not present

## 2017-05-07 DIAGNOSIS — C259 Malignant neoplasm of pancreas, unspecified: Secondary | ICD-10-CM | POA: Diagnosis not present

## 2017-05-07 DIAGNOSIS — F419 Anxiety disorder, unspecified: Secondary | ICD-10-CM | POA: Diagnosis not present

## 2017-05-07 DIAGNOSIS — G893 Neoplasm related pain (acute) (chronic): Secondary | ICD-10-CM | POA: Diagnosis not present

## 2017-05-08 DIAGNOSIS — F419 Anxiety disorder, unspecified: Secondary | ICD-10-CM | POA: Diagnosis not present

## 2017-05-08 DIAGNOSIS — C259 Malignant neoplasm of pancreas, unspecified: Secondary | ICD-10-CM | POA: Diagnosis not present

## 2017-05-08 DIAGNOSIS — G893 Neoplasm related pain (acute) (chronic): Secondary | ICD-10-CM | POA: Diagnosis not present

## 2017-05-09 DIAGNOSIS — C259 Malignant neoplasm of pancreas, unspecified: Secondary | ICD-10-CM | POA: Diagnosis not present

## 2017-05-09 DIAGNOSIS — F419 Anxiety disorder, unspecified: Secondary | ICD-10-CM | POA: Diagnosis not present

## 2017-05-09 DIAGNOSIS — G893 Neoplasm related pain (acute) (chronic): Secondary | ICD-10-CM | POA: Diagnosis not present

## 2017-05-10 DIAGNOSIS — G893 Neoplasm related pain (acute) (chronic): Secondary | ICD-10-CM | POA: Diagnosis not present

## 2017-05-10 DIAGNOSIS — F419 Anxiety disorder, unspecified: Secondary | ICD-10-CM | POA: Diagnosis not present

## 2017-05-10 DIAGNOSIS — C259 Malignant neoplasm of pancreas, unspecified: Secondary | ICD-10-CM | POA: Diagnosis not present

## 2017-05-11 DIAGNOSIS — F419 Anxiety disorder, unspecified: Secondary | ICD-10-CM | POA: Diagnosis not present

## 2017-05-11 DIAGNOSIS — G893 Neoplasm related pain (acute) (chronic): Secondary | ICD-10-CM | POA: Diagnosis not present

## 2017-05-11 DIAGNOSIS — C259 Malignant neoplasm of pancreas, unspecified: Secondary | ICD-10-CM | POA: Diagnosis not present

## 2017-05-15 DIAGNOSIS — F419 Anxiety disorder, unspecified: Secondary | ICD-10-CM | POA: Diagnosis not present

## 2017-05-15 DIAGNOSIS — G893 Neoplasm related pain (acute) (chronic): Secondary | ICD-10-CM | POA: Diagnosis not present

## 2017-05-15 DIAGNOSIS — C259 Malignant neoplasm of pancreas, unspecified: Secondary | ICD-10-CM | POA: Diagnosis not present

## 2017-05-17 DIAGNOSIS — C259 Malignant neoplasm of pancreas, unspecified: Secondary | ICD-10-CM | POA: Diagnosis not present

## 2017-05-17 DIAGNOSIS — G893 Neoplasm related pain (acute) (chronic): Secondary | ICD-10-CM | POA: Diagnosis not present

## 2017-05-17 DIAGNOSIS — F419 Anxiety disorder, unspecified: Secondary | ICD-10-CM | POA: Diagnosis not present

## 2017-05-19 ENCOUNTER — Encounter: Payer: Self-pay | Admitting: Gynecology

## 2017-05-19 ENCOUNTER — Ambulatory Visit
Admission: EM | Admit: 2017-05-19 | Discharge: 2017-05-19 | Disposition: A | Discharge status: Home / Self Care | Attending: Registered Nurse | Admitting: Registered Nurse

## 2017-05-19 DIAGNOSIS — R300 Dysuria: Secondary | ICD-10-CM | POA: Diagnosis present

## 2017-05-19 DIAGNOSIS — J0100 Acute maxillary sinusitis, unspecified: Secondary | ICD-10-CM | POA: Diagnosis not present

## 2017-05-19 DIAGNOSIS — H6593 Unspecified nonsuppurative otitis media, bilateral: Secondary | ICD-10-CM

## 2017-05-19 DIAGNOSIS — H65195 Other acute nonsuppurative otitis media, recurrent, left ear: Secondary | ICD-10-CM

## 2017-05-19 DIAGNOSIS — N3001 Acute cystitis with hematuria: Secondary | ICD-10-CM

## 2017-05-19 LAB — URINALYSIS, COMPLETE (UACMP) WITH MICROSCOPIC
Bilirubin Urine: NEGATIVE
GLUCOSE, UA: NEGATIVE mg/dL
Hgb urine dipstick: NEGATIVE
Ketones, ur: NEGATIVE mg/dL
Nitrite: NEGATIVE
PH: 7 (ref 5.0–8.0)
RBC / HPF: NONE SEEN RBC/hpf (ref 0–5)
Specific Gravity, Urine: 1.025 (ref 1.005–1.030)
Squamous Epithelial / LPF: NONE SEEN

## 2017-05-19 MED ORDER — SALINE SPRAY 0.65 % NA SOLN: 2.0000 | NASAL | 0 refills | Status: AC

## 2017-05-19 MED ORDER — CEFUROXIME AXETIL 250 MG PO TABS: 250.0000 mg | ORAL_TABLET | Freq: Two times a day (BID) | ORAL | 0 refills | Status: AC

## 2017-05-19 NOTE — ED Provider Notes (Signed)
CSN: [id number]     Arrival date & time 05/19/17  1523 History   First MD Initiated Contact with Patient 05/19/17 1556     Chief Complaint  Patient presents with  . Urinary Tract Infection   (Consider location/radiation/quality/duration/timing/severity/associated sxs/prior Treatment) 81y/o caucasian female established patient here for evaluation dysuria change in smell/color and amount decreased.  Patient also having increased confusion, headache, sinus discharge/post nasal drip.  Denied nausea/vomiting/diarrhea/fever/chills/hematuria  PMHx back pain, urinary retention/incontinence, COPD, seasonal allergies, hiatal hernia, GERD, depression, hearing loss (not wearing hearing aids due to papule right external canal painful unable to insert hearing aid correctly), hypothyroidism, hypertension,   PSHx back surgery, hysterectomy, cataract excision, tonsillectomy  Patient allergic to sulfa drugs and has not tolerated ciprofloxacin in the past  Last UTI recurrent 1 year ago treated with ceftin and macrobid saw Dr Posey Pronto May 2017 and NP Mendel Ryder June.  E coli on urine culture but sometimes urinalysis clear and culture no growth      Past Medical History:  Diagnosis Date  . Back pain, chronic   . COPD (chronic obstructive pulmonary disease) ([facility])    in past, no issues currently  . Depression   . Environmental and seasonal allergies   . GERD (gastroesophageal reflux disease)   . Heart murmur   . History of hiatal hernia   . History of stomach ulcers   . HOH (hard of hearing)    aids  . Hypercholesteremia   . Hypertension   . Hypothyroidism   . IBS (irritable bowel syndrome)   . Left leg weakness    s/p back surgeries  . Mitral valve disorder    prolaspe  . Motion sickness    cars, boats  . Occasional tremors   . Osteoporosis   . PTSD (post-traumatic stress disorder)   . Shortness of breath dyspnea    doe  . Sleep apnea    cpap -doesn't use   Past Surgical History:  Procedure  Laterality Date  . ABDOMINAL HYSTERECTOMY    . BACK SURGERY     times 3  . CATARACT EXTRACTION W/PHACO Left 08/25/2015   Procedure: CATARACT EXTRACTION PHACO AND INTRAOCULAR LENS PLACEMENT (IOC) WITH IOL COMPLICATED;  Surgeon: Leandrew Koyanagi, MD;  Location: [facility];  Service: Ophthalmology;  Laterality: Left;  IVA BLOCK CPAP  . EXCISION OF SKIN TAG     leisions  . EYE SURGERY     left detach retina  . MASTOIDECTOMY    . MEMBRANE PEEL Left 01/12/2016   Procedure: MEMBRANE PEEL;  Surgeon: Milus Height, MD;  Location: [facility];  Service: Ophthalmology;  Laterality: Left;  . NASAL SEPTUM SURGERY    . PARS PLANA VITRECTOMY Left 04/14/2015   Procedure: PARS PLANA VITRECTOMY WITH 25 GAUGE, endolaser, sf6 gas exchange left eye;  Surgeon: Milus Height, MD;  Location: [facility];  Service: Ophthalmology;  Laterality: Left;  . PARS PLANA VITRECTOMY Left 01/12/2016   Procedure: PARS PLANA VITRECTOMY WITH 25 GAUGE;  Surgeon: Milus Height, MD;  Location: [facility];  Service: Ophthalmology;  Laterality: Left;  SF6:  14% casette lot #[id number] H exp  05/2017  . TONSILLECTOMY     No family history on file. Social History  Substance Use Topics  . Smoking status: Never Smoker  . Smokeless tobacco: Never Used  . Alcohol use No   OB History    No data available     Review of Systems  Constitutional: Negative for activity change, appetite change, chills, diaphoresis, fatigue, fever and  unexpected weight change.  HENT: Positive for congestion, ear pain, postnasal drip, sinus pain, sinus pressure and sore throat. Negative for dental problem, drooling, ear discharge, facial swelling, hearing loss, mouth sores, nosebleeds, rhinorrhea, sneezing, tinnitus, trouble swallowing and voice change.   Eyes: Negative for photophobia, pain, discharge, redness, itching and visual disturbance.  Respiratory: Negative for cough, choking, chest tightness, shortness of breath, wheezing and stridor.     Cardiovascular: Negative for chest pain, palpitations and leg swelling.  Gastrointestinal: Negative for abdominal distention, abdominal pain, blood in stool, constipation, diarrhea, nausea and vomiting.  Endocrine: Negative for cold intolerance and heat intolerance.  Genitourinary: Positive for decreased urine volume, difficulty urinating, dysuria and enuresis. Negative for dyspareunia, flank pain, frequency, genital sores, hematuria, menstrual problem, pelvic pain, urgency, vaginal bleeding, vaginal discharge and vaginal pain.  Musculoskeletal: Positive for back pain and gait problem. Negative for arthralgias, joint swelling, myalgias, neck pain and neck stiffness.  Skin: Negative for color change, pallor, rash and wound.  Allergic/Immunologic: Positive for environmental allergies and food allergies.  Neurological: Positive for weakness and headaches. Negative for dizziness, tremors, seizures, syncope, facial asymmetry, speech difficulty, light-headedness and numbness.  Hematological: Negative for adenopathy. Does not bruise/bleed easily.  Psychiatric/Behavioral: Positive for confusion. Negative for agitation, behavioral problems and sleep disturbance.    Allergies  Other; Pumpkin seed oil; Shellfish allergy; Shellfish-derived products; Spinach; and Sulfa antibiotics  Home Medications   Prior to Admission medications   Medication Sig Start Date End Date Taking? Authorizing Provider  albuterol (PROVENTIL HFA;VENTOLIN HFA) 108 (90 BASE) MCG/ACT inhaler Inhale 1-2 puffs into the lungs every 6 (six) hours as needed for wheezing or shortness of breath. 06/04/15  Yes Paulina Fusi, MD  aspirin 81 MG tablet Take by mouth daily.   Yes [provider]  atorvastatin (LIPITOR) 40 MG tablet Take 40 mg by mouth daily. PM   Yes [provider]  buPROPion (WELLBUTRIN) 100 MG tablet Take 150 mg by mouth 2 (two) times daily.    Yes [provider]  calcium-vitamin D (OSCAL WITH D)  500-200 MG-UNIT tablet Take 1 tablet by mouth.   Yes [provider]  clonazePAM (KLONOPIN) 0.5 MG tablet Take 0.5 mg by mouth every morning.    Yes [provider]  donepezil (ARICEPT) 10 MG tablet Take 10 mg by mouth at bedtime. AM   Yes [provider]  esomeprazole (NEXIUM) 40 MG packet Take 40 mg by mouth daily before breakfast.   Yes [provider]  felodipine (PLENDIL) 2.5 MG 24 hr tablet Take 2.5 mg by mouth at bedtime. PM   Yes [provider]  fluticasone (FLONASE) 50 MCG/ACT nasal spray Place 1 spray into both nostrils every morning.   Yes [provider]  gabapentin (NEURONTIN) 100 MG capsule Take 100 mg by mouth 2 (two) times daily.    Yes [provider]  hydrochlorothiazide (MICROZIDE) 12.5 MG capsule Take by mouth daily.   Yes [provider]  Multiple Vitamin (MULTIVITAMIN) capsule Take 1 capsule by mouth daily.   Yes [provider]  Multiple Vitamins-Minerals (PRESERVISION AREDS 2 PO) Take 1 tablet by mouth 2 (two) times daily.    Yes [provider]  sertraline (ZOLOFT) 100 MG tablet Take 100 mg by mouth 2 (two) times daily.    Yes [provider]  simvastatin (ZOCOR) 20 MG tablet Take 20 mg by mouth daily at 6 PM.   Yes [provider]  traMADol (ULTRAM) 50 MG tablet Take by  mouth every 4 (four) hours as needed.   Yes [provider]  Travoprost, BAK Free, (TRAVATAN) 0.004 % SOLN ophthalmic solution    Yes [provider]  triamterene-hydrochlorothiazide (MAXZIDE) 75-50 MG per tablet Take 0.5 tablets by mouth daily.   Yes [provider]  vitamin E 100 UNIT capsule Take by mouth daily.   Yes [provider]  cefUROXime (CEFTIN) 250 MG tablet Take 1 tablet (250 mg total) by mouth 2 (two) times daily with a meal. 05/19/17 05/29/17  Kaycie Pegues, Aura Fey, NP  levothyroxine (SYNTHROID, LEVOTHROID) 88 MCG tablet Take 88 mcg by mouth daily before  breakfast.    [provider]  lisinopril (PRINIVIL,ZESTRIL) 30 MG tablet Take 30 mg by mouth at bedtime.    [provider]  metFORMIN (GLUCOPHAGE) 500 MG tablet Take 500 mg by mouth 2 (two) times daily with a meal.    [provider]  sodium chloride (OCEAN) 0.65 % SOLN nasal spray Place 2 sprays into both nostrils every 2 (two) hours while awake. 05/19/17 06/18/17  Tacy Chavis, Aura Fey, NP   Meds Ordered and Administered this Visit  Medications - No data to display  BP (!) 150/80 (BP Location: Left Arm)   Pulse 74   Temp 97.9 F (36.6 C) (Oral)   Resp 14   Ht 5\' 2"  (1.575 m)   Wt 110 lb (49.9 kg)   SpO2 98%   BMI 20.12 kg/m  No data found.   Physical Exam  Constitutional: She is oriented to person, place, and time. Vital signs are normal. She appears well-developed and well-nourished. She is active and cooperative.  Non-toxic appearance. She has a sickly appearance. She does not appear ill. No distress.  HENT:  Head: Normocephalic and atraumatic.  Right Ear: Hearing, external ear and ear canal normal. A middle ear effusion is present.  Left Ear: Hearing, external ear and ear canal normal. A middle ear effusion is present.  Nose: Mucosal edema and rhinorrhea present. No nose lacerations, sinus tenderness, nasal deformity, septal deviation or nasal septal hematoma. No epistaxis.  No foreign bodies. Right sinus exhibits maxillary sinus tenderness. Right sinus exhibits no frontal sinus tenderness. Left sinus exhibits maxillary sinus tenderness. Left sinus exhibits no frontal sinus tenderness.  Mouth/Throat: Uvula is midline and mucous membranes are normal. Mucous membranes are not pale, not dry and not cyanotic. She does not have dentures. No oral lesions. No trismus in the jaw. Normal dentition. No dental abscesses, uvula swelling, lacerations or dental caries. Posterior oropharyngeal edema and posterior oropharyngeal erythema present. No oropharyngeal exudate or  tonsillar abscesses.  Bilateral allergic shiners; cobblestoning posterior pharynx; bilateral nasal turbinates with edema/erythema; bilateral TMs air fluid level clear; right external auditory canal near tragus papule flesh colored TTP 9 oclock  Eyes: Conjunctivae, EOM and lids are normal. Pupils are equal, round, and reactive to light. Right eye exhibits no chemosis, no discharge, no exudate and no hordeolum. No foreign body present in the right eye. Left eye exhibits no chemosis, no discharge, no exudate and no hordeolum. No foreign body present in the left eye. Right conjunctiva is not injected. Right conjunctiva has no hemorrhage. Left conjunctiva is not injected. Left conjunctiva has no hemorrhage. No scleral icterus. Right eye exhibits normal extraocular motion and no nystagmus. Left eye exhibits normal extraocular motion and no nystagmus. Right pupil is round and reactive. Left pupil is round and reactive. Pupils are equal.  Neck: Trachea normal, normal range of motion and phonation normal. Neck supple.  No tracheal tenderness, no spinous process tenderness and no muscular tenderness present. No neck rigidity. No tracheal deviation, no edema, no erythema and normal range of motion present. No thyroid mass and no thyromegaly present.  Cardiovascular: Normal rate, regular rhythm, S1 normal, S2 normal, normal heart sounds and intact distal pulses.  PMI is not displaced.  Exam reveals no gallop and no friction rub.   No murmur heard. Pulmonary/Chest: Effort normal and breath sounds normal. No accessory muscle usage or stridor. No respiratory distress. She has no decreased breath sounds. She has no wheezes. She has no rhonchi. She has no rales. She exhibits no tenderness.  Abdominal: Soft. Normal appearance and bowel sounds are normal. She exhibits no distension, no fluid wave, no ascites and no mass. There is no tenderness. There is no rigidity, no rebound, no guarding and no CVA tenderness. No hernia.  Hernia confirmed negative in the ventral area.  Genitourinary: No vaginal discharge found.  Musculoskeletal: Normal range of motion. She exhibits no edema, tenderness or deformity.       Right shoulder: Normal.       Left shoulder: Normal.       Right elbow: Normal.      Left elbow: Normal.       Right hip: Normal.       Left hip: Normal.       Right knee: Normal.       Left knee: Normal.       Right ankle: Normal.       Left ankle: Normal.       Cervical back: Normal.       Lumbar back: She exhibits pain.       Right hand: Normal.       Left hand: Normal.  Patient in wheelchair gait not observed required assistance for transfer from wheelchair to gurney or toilet and back.    Lymphadenopathy:       Head (right side): No submental, no submandibular, no tonsillar, no preauricular, no posterior auricular and no occipital adenopathy present.       Head (left side): No submental, no submandibular, no tonsillar, no preauricular, no posterior auricular and no occipital adenopathy present.    She has no cervical adenopathy.       Right cervical: No superficial cervical, no deep cervical and no posterior cervical adenopathy present.      Left cervical: No superficial cervical, no deep cervical and no posterior cervical adenopathy present.  Neurological: She is alert and oriented to person, place, and time. She has normal strength. She is not disoriented. She displays no atrophy and no tremor. No cranial nerve deficit or sensory deficit. She exhibits normal muscle tone. She displays no seizure activity. Gait abnormal. Coordination normal. GCS eye subscore is 4. GCS verbal subscore is 5. GCS motor subscore is 6.  Patient strength 4/5 bilaterally upper and lower extremities required assistance for transfer from wheelchair to gurney/toilet and back for examination and catheterization urine in/out  Skin: Skin is warm, dry and intact. Capillary refill takes less than 2 seconds. No abrasion, no bruising,  no burn, no ecchymosis, no laceration, no lesion, no petechiae and no rash noted. She is not diaphoretic. No cyanosis or erythema. No pallor. Nails show no clubbing.  Psychiatric: She has a normal mood and affect. Her speech is normal and behavior is normal. Judgment and thought content normal. She is not actively hallucinating. Cognition and memory are normal. She is attentive.  Nursing note and vitals reviewed.  Urgent Care Course     Procedures (including critical care time)  Labs Review Labs Reviewed  URINALYSIS, COMPLETE (UACMP) WITH MICROSCOPIC - Abnormal; Notable for the following:       Result Value   APPearance HAZY (*)    Protein, ur TRACE (*)    Leukocytes, UA TRACE (*)    Bacteria, UA RARE (*)    All other components within normal limits  URINE CULTURE    Imaging Review No results found.  Patient attempted to give clean catch specimen with daughter assistance in bathroom but unable to urinate after drinking 20 oz bottle of water in clinic.  Patient had required in/out catheterization last year to obtain urine sample.  Patient requested catheterization after inability to urinate.  She was wearing depends undergarment dry.  Straight catheterization ordered and performed by self with assistance of [place] to help with patient positioning and preparation.  Swabbed x 3 with povidine iodine swabs perineum and collected 16ml urine amber slightly hazy malodorous.  Daughter and Vanessa Redfield CMA assisted patient to redress after procedure.  Drape was utilized for procedure-sheet.  Specimen Description URINE, CLEAN CATCH   Special Requests Normal   Culture >=100,000 COLONIES/mL ESCHERICHIA COLI    Report Status 04/27/2016 FINAL   Organism ID, Bacteria ESCHERICHIA COLI    Resulting Agency [facility]  Susceptibility    Escherichia coli    MIC    AMPICILLIN >=32 RESIST... Resistant    AMPICILLIN/SULBACTAM >=32 RESIST... Resistant    CEFAZOLIN <=4 SENSITIVE "><=4  SENSITIVE  Sensitive    CEFTRIAXONE <=1 SENSITIVE "><=1 SENSITIVE  Sensitive    CIPROFLOXACIN <=0.25 SENSITIVE "><=0.25 SENS... Sensitive    Extended ESBL NEGATIVE  Sensitive    GENTAMICIN <=1 SENSITIVE "><=1 SENSITIVE  Sensitive    IMIPENEM <=0.25 SENSITIVE "><=0.25 SENS... Sensitive    NITROFURANTOIN <=16 SENSITIVE "><=16 SENSIT... Sensitive    PIP/TAZO <=4 SENSITIVE "><=4 SENSITIVE  Sensitive    TRIMETH/SULFA <=20 SENSITIVE "><=20 SENSIT... Sensitive      Discussed with patient and family urinalysis results possible UTI.  Due to sinusitis will treat with ceftin.  Discussed maintaining hydration to keep urine pale yellow clear and avoiding dehydration.  Will call with urine culture results once available typically 48-72 hours.  Patient and daughters verbalized understanding information/instructions, agreed with plan of care and had no further questions at this time.    MDM   1. Acute cystitis with hematuria   2. Acute maxillary sinusitis, recurrence not specified   3. Otitis media with effusion, bilateral    Medications as directed. Rx ceftin 250mg  po BID x 10 days #20 RF0 also covers for e. Coli UTI.  Staff will call with urine culture results once available typically 48-72 hours. Patient is also to push fluids.  Reiterated temperatures in 90s, age and her medications increase risk of dehydration and acute kidney injury. Hydrate, avoid dehydration.  Avoid holding urine void on frequent basis every 4 to 6 hours.  If unable to void every 8 hours follow up for re-evaluation with PCM, urgent care or ER.   Call or return to clinic as needed if these symptoms worsen or fail to improve as anticipated.  Exitcare handout on cystitis given to patient Discussed medications and irritation from urinary incontinence perineum could also be causing dysuria/cystitis.  Patient and daughters verbalized agreement and understanding of treatment plan and had no further questions at this time. P2:  Hydrate and  cranberry juice  Supportive treatment.   No evidence  of invasive bacterial infection, non toxic and well hydrated.  This is most likely self limiting viral infection.  I do not see where any further testing or imaging is necessary at this time.   I will suggest supportive care, rest, good hygiene and encourage the patient to take adequate fluids.  The patient is to return to clinic or EMERGENCY ROOM if symptoms worsen or change significantly e.g. ear pain, fever, purulent discharge from ears or bleeding.  Exitcare handout on otitis media with effusion given to patient.  Patient verbalized agreement and understanding of treatment plan.     Ceftin 250mg  po BID x 10 days #20 RF0.  Tylenol 1000mg  po QID prn pain.  Nasal saline 2 sprays each nostril q2h wa.  No evidence of systemic bacterial infection, non toxic and well hydrated.  I do not see where any further testing or imaging is necessary at this time.   I will suggest supportive care, rest, good hygiene and encourage the patient to take adequate fluids.  The patient is to return to clinic or EMERGENCY ROOM if symptoms worsen or change significantly.  Exitcare handout on sinusitis given to patient.  Daughters and Patient verbalized agreement and understanding of treatment plan and had no further questions at this time.   P2:  Hand washing and cover cough     Olen Cordial, NP 05/19/17 2241

## 2017-05-19 NOTE — ED Triage Notes (Signed)
Per daughter mom with painful / burning urination x 3-4 days.

## 2017-05-21 DIAGNOSIS — R1013 Epigastric pain: Secondary | ICD-10-CM | POA: Diagnosis not present

## 2017-05-21 DIAGNOSIS — M542 Cervicalgia: Secondary | ICD-10-CM | POA: Diagnosis not present

## 2017-05-21 DIAGNOSIS — M549 Dorsalgia, unspecified: Secondary | ICD-10-CM | POA: Diagnosis not present

## 2017-05-21 DIAGNOSIS — R109 Unspecified abdominal pain: Secondary | ICD-10-CM | POA: Diagnosis not present

## 2017-05-21 DIAGNOSIS — R4182 Altered mental status, unspecified: Secondary | ICD-10-CM | POA: Diagnosis not present

## 2017-05-21 DIAGNOSIS — M5126 Other intervertebral disc displacement, lumbar region: Secondary | ICD-10-CM | POA: Diagnosis not present

## 2017-05-21 DIAGNOSIS — K869 Disease of pancreas, unspecified: Secondary | ICD-10-CM | POA: Diagnosis not present

## 2017-05-21 DIAGNOSIS — R222 Localized swelling, mass and lump, trunk: Secondary | ICD-10-CM | POA: Diagnosis not present

## 2017-05-21 DIAGNOSIS — M546 Pain in thoracic spine: Secondary | ICD-10-CM | POA: Diagnosis not present

## 2017-05-21 DIAGNOSIS — R410 Disorientation, unspecified: Secondary | ICD-10-CM | POA: Diagnosis not present

## 2017-05-21 DIAGNOSIS — E876 Hypokalemia: Secondary | ICD-10-CM | POA: Diagnosis not present

## 2017-05-21 LAB — URINE CULTURE: SPECIAL REQUESTS: NORMAL

## 2017-05-22 ENCOUNTER — Telehealth: Payer: Self-pay | Admitting: Registered Nurse

## 2017-05-22 ENCOUNTER — Encounter: Payer: Self-pay | Admitting: Registered Nurse

## 2017-05-22 DIAGNOSIS — F419 Anxiety disorder, unspecified: Secondary | ICD-10-CM | POA: Diagnosis not present

## 2017-05-22 DIAGNOSIS — C259 Malignant neoplasm of pancreas, unspecified: Secondary | ICD-10-CM | POA: Diagnosis not present

## 2017-05-22 DIAGNOSIS — G893 Neoplasm related pain (acute) (chronic): Secondary | ICD-10-CM | POA: Diagnosis not present

## 2017-05-22 NOTE — Telephone Encounter (Signed)
Telephone message left that urine culture grew out multiple species.  Was a catheterization in clinic aseptic technique to obtain sample.  Requested to call clinic to verify if symptoms improved or worsened with plan of care.

## 2017-05-24 ENCOUNTER — Telehealth: Payer: Self-pay | Admitting: Registered Nurse

## 2017-05-24 NOTE — Telephone Encounter (Signed)
Noted patient was seen at [facility] ED 05/21/2016 for confusion.  Multiple CTs and labs performed.  Patient was instructed to follow up with [facility] by ER provider.  Urinalysis WNL catheter sample.  Attempted to reach Maudie Mercury to see how patient doing today and no answer left voicemail for her to contact clinic for status update on [place] and ask if appt was scheduled with PCM to follow up ED and [facility] visits.

## 2017-05-25 DIAGNOSIS — C250 Malignant neoplasm of head of pancreas: Secondary | ICD-10-CM | POA: Diagnosis not present

## 2017-05-25 DIAGNOSIS — C251 Malignant neoplasm of body of pancreas: Secondary | ICD-10-CM | POA: Diagnosis not present

## 2017-06-04 DIAGNOSIS — C250 Malignant neoplasm of head of pancreas: Secondary | ICD-10-CM | POA: Diagnosis not present

## 2017-06-04 DIAGNOSIS — R918 Other nonspecific abnormal finding of lung field: Secondary | ICD-10-CM | POA: Diagnosis not present

## 2017-06-04 DIAGNOSIS — C259 Malignant neoplasm of pancreas, unspecified: Secondary | ICD-10-CM | POA: Diagnosis not present

## 2017-06-24 ENCOUNTER — Encounter: Payer: Self-pay | Admitting: Emergency Medicine

## 2017-06-24 ENCOUNTER — Ambulatory Visit
Admission: EM | Admit: 2017-06-24 | Discharge: 2017-06-24 | Disposition: A | Discharge status: Home / Self Care | Payer: Medicare Other | Attending: Emergency Medicine | Admitting: Emergency Medicine

## 2017-06-24 DIAGNOSIS — G8929 Other chronic pain: Secondary | ICD-10-CM | POA: Insufficient documentation

## 2017-06-24 DIAGNOSIS — J449 Chronic obstructive pulmonary disease, unspecified: Secondary | ICD-10-CM | POA: Diagnosis not present

## 2017-06-24 DIAGNOSIS — F329 Major depressive disorder, single episode, unspecified: Secondary | ICD-10-CM | POA: Diagnosis not present

## 2017-06-24 DIAGNOSIS — K219 Gastro-esophageal reflux disease without esophagitis: Secondary | ICD-10-CM | POA: Diagnosis not present

## 2017-06-24 DIAGNOSIS — K589 Irritable bowel syndrome without diarrhea: Secondary | ICD-10-CM | POA: Insufficient documentation

## 2017-06-24 DIAGNOSIS — F4310 Post-traumatic stress disorder, unspecified: Secondary | ICD-10-CM | POA: Diagnosis not present

## 2017-06-24 DIAGNOSIS — E039 Hypothyroidism, unspecified: Secondary | ICD-10-CM | POA: Diagnosis not present

## 2017-06-24 DIAGNOSIS — E7800 Pure hypercholesterolemia, unspecified: Secondary | ICD-10-CM | POA: Insufficient documentation

## 2017-06-24 DIAGNOSIS — N930 Postcoital and contact bleeding: Secondary | ICD-10-CM | POA: Insufficient documentation

## 2017-06-24 DIAGNOSIS — Z8719 Personal history of other diseases of the digestive system: Secondary | ICD-10-CM | POA: Diagnosis not present

## 2017-06-24 DIAGNOSIS — N390 Urinary tract infection, site not specified: Secondary | ICD-10-CM | POA: Diagnosis not present

## 2017-06-24 DIAGNOSIS — R300 Dysuria: Secondary | ICD-10-CM | POA: Diagnosis present

## 2017-06-24 DIAGNOSIS — G4730 Sleep apnea, unspecified: Secondary | ICD-10-CM | POA: Diagnosis not present

## 2017-06-24 DIAGNOSIS — Z7982 Long term (current) use of aspirin: Secondary | ICD-10-CM | POA: Insufficient documentation

## 2017-06-24 DIAGNOSIS — Z8507 Personal history of malignant neoplasm of pancreas: Secondary | ICD-10-CM | POA: Insufficient documentation

## 2017-06-24 DIAGNOSIS — Z7984 Long term (current) use of oral hypoglycemic drugs: Secondary | ICD-10-CM | POA: Insufficient documentation

## 2017-06-24 DIAGNOSIS — I10 Essential (primary) hypertension: Secondary | ICD-10-CM | POA: Insufficient documentation

## 2017-06-24 HISTORY — DX: Malignant neoplasm of pancreas, unspecified: C25.9

## 2017-06-24 LAB — URINALYSIS, COMPLETE (UACMP) WITH MICROSCOPIC
Bilirubin Urine: NEGATIVE
GLUCOSE, UA: NEGATIVE mg/dL
KETONES UR: NEGATIVE mg/dL
Nitrite: NEGATIVE
PH: 5.5 (ref 5.0–8.0)
Protein, ur: 100 mg/dL — AB
SPECIFIC GRAVITY, URINE: 1.02 (ref 1.005–1.030)

## 2017-06-24 MED ORDER — NITROFURANTOIN MONOHYD MACRO 100 MG PO CAPS: 100.0000 mg | ORAL_CAPSULE | Freq: Two times a day (BID) | ORAL | 0 refills | Status: AC

## 2017-06-24 MED ORDER — BETHANECHOL CHLORIDE 25 MG PO TABS: 25.0000 mg | ORAL_TABLET | Freq: Three times a day (TID) | ORAL | 0 refills | Status: AC

## 2017-06-24 NOTE — ED Triage Notes (Signed)
Patient c/o burning when urinating and increase in urinary urgency since Friday.  Patient previously on antibiotic for sinus infection.

## 2017-06-24 NOTE — ED Provider Notes (Signed)
CSN: [id number]     Arrival date & time 06/24/17  1514 History   First MD Initiated Contact with Patient 06/24/17 1533     Chief Complaint  Patient presents with  . Dysuria   (Consider location/radiation/quality/duration/timing/severity/associated sxs/prior Treatment) HPI  This a 81 year old female who until very recently was in hospice on chronic constant morphine. Due to a dispute with the family ,they removed her from hospice. She was seen here in June with a UTI treated with Ceftin 250 mg twice a day for 7 days. She had a reinfection  and was seen at [facility] primary who increased the Ceftin to 500 mg twice a day and treated her for 14 days. She completed that course about one week ago. On Friday she noted to have urgency frequency, incontinence on 2 occasions and dysuria. She's had no fever or chills. The daughter decided to wait until today to bring her in, hoping that had cleared up from her previous antibiotics but it has not. They're also requesting a refill of her bladder spasm medicine Urecholine.      Past Medical History:  Diagnosis Date  . Back pain, chronic   . COPD (chronic obstructive pulmonary disease) ([place])    in past, no issues currently  . Depression   . Environmental and seasonal allergies   . GERD (gastroesophageal reflux disease)   . Heart murmur   . History of hiatal hernia   . History of stomach ulcers   . HOH (hard of hearing)    aids  . Hypercholesteremia   . Hypertension   . Hypothyroidism   . IBS (irritable bowel syndrome)   . Left leg weakness    s/p back surgeries  . Mitral valve disorder    prolaspe  . Motion sickness    cars, boats  . Occasional tremors   . Osteoporosis   . Pancreatic cancer ([place])   . PTSD (post-traumatic stress disorder)   . Shortness of breath dyspnea    doe  . Sleep apnea    cpap -doesn't use   Past Surgical History:  Procedure Laterality Date  . ABDOMINAL HYSTERECTOMY    . BACK SURGERY     times 3  . CATARACT  EXTRACTION W/PHACO Left 08/25/2015   Procedure: CATARACT EXTRACTION PHACO AND INTRAOCULAR LENS PLACEMENT (IOC) WITH IOL COMPLICATED;  Surgeon: Leandrew Koyanagi, MD;  Location: [place];  Service: Ophthalmology;  Laterality: Left;  IVA BLOCK CPAP  . EXCISION OF SKIN TAG     leisions  . EYE SURGERY     left detach retina  . MASTOIDECTOMY    . MEMBRANE PEEL Left 01/12/2016   Procedure: MEMBRANE PEEL;  Surgeon: Milus Height, MD;  Location: [facility];  Service: Ophthalmology;  Laterality: Left;  . NASAL SEPTUM SURGERY    . PARS PLANA VITRECTOMY Left 04/14/2015   Procedure: PARS PLANA VITRECTOMY WITH 25 GAUGE, endolaser, sf6 gas exchange left eye;  Surgeon: Milus Height, MD;  Location: [facility];  Service: Ophthalmology;  Laterality: Left;  . PARS PLANA VITRECTOMY Left 01/12/2016   Procedure: PARS PLANA VITRECTOMY WITH 25 GAUGE;  Surgeon: Milus Height, MD;  Location: [facility];  Service: Ophthalmology;  Laterality: Left;  SF6:  14% casette lot #[id number] H exp  05/2017  . TONSILLECTOMY     History reviewed. No pertinent family history. Social History  Substance Use Topics  . Smoking status: Never Smoker  . Smokeless tobacco: Never Used  . Alcohol use No   OB History  No data available     Review of Systems  Constitutional: Positive for activity change. Negative for chills, fatigue and fever.  Genitourinary: Positive for dysuria, frequency and urgency.  All other systems reviewed and are negative.   Allergies  Other; Pumpkin seed oil; Shellfish allergy; Shellfish-derived products; Spinach; and Sulfa antibiotics  Home Medications   Prior to Admission medications   Medication Sig Start Date End Date Taking? Authorizing Provider  albuterol (PROVENTIL HFA;VENTOLIN HFA) 108 (90 BASE) MCG/ACT inhaler Inhale 1-2 puffs into the lungs every 6 (six) hours as needed for wheezing or shortness of breath. 06/04/15   Paulina Fusi, MD  aspirin 81 MG tablet Take by mouth daily.     [provider]  atorvastatin (LIPITOR) 40 MG tablet Take 40 mg by mouth daily. PM    [provider]  bethanechol (URECHOLINE) 25 MG tablet Take 1 tablet (25 mg total) by mouth 3 (three) times daily. 06/24/17   Lorin Picket, PA-C  buPROPion (WELLBUTRIN) 100 MG tablet Take 150 mg by mouth 2 (two) times daily.     [provider]  calcium-vitamin D (OSCAL WITH D) 500-200 MG-UNIT tablet Take 1 tablet by mouth.    [provider]  clonazePAM (KLONOPIN) 0.5 MG tablet Take 0.5 mg by mouth every morning.     [provider]  donepezil (ARICEPT) 10 MG tablet Take 10 mg by mouth at bedtime. AM    [provider]  esomeprazole (NEXIUM) 40 MG packet Take 40 mg by mouth daily before breakfast.    [provider]  felodipine (PLENDIL) 2.5 MG 24 hr tablet Take 2.5 mg by mouth at bedtime. PM    [provider]  fluticasone (FLONASE) 50 MCG/ACT nasal spray Place 1 spray into both nostrils every morning.    [provider]  gabapentin (NEURONTIN) 100 MG capsule Take 100 mg by mouth 2 (two) times daily.     [provider]  hydrochlorothiazide (MICROZIDE) 12.5 MG capsule Take by mouth daily.    [provider]  levothyroxine (SYNTHROID, LEVOTHROID) 88 MCG tablet Take 88 mcg by mouth daily before breakfast.    [provider]  lisinopril (PRINIVIL,ZESTRIL) 30 MG tablet Take 30 mg by mouth at bedtime.    [provider]  metFORMIN (GLUCOPHAGE) 500 MG tablet Take 500 mg by mouth 2 (two) times daily with a meal.    [provider]  Multiple Vitamin (MULTIVITAMIN) capsule Take 1 capsule by mouth daily.    [provider]  Multiple Vitamins-Minerals (PRESERVISION AREDS 2 PO) Take 1 tablet by mouth 2 (two) times daily.     [provider]  nitrofurantoin, macrocrystal-monohydrate, (MACROBID) 100 MG capsule Take 1 capsule (100 mg total) by mouth 2 (two) times daily. 06/24/17    Lorin Picket, PA-C  sertraline (ZOLOFT) 100 MG tablet Take 100 mg by mouth 2 (two) times daily.     [provider]  simvastatin (ZOCOR) 20 MG tablet Take 20 mg by mouth daily at 6 PM.    [provider]  sodium chloride (OCEAN) 0.65 % SOLN nasal spray Place 2 sprays into both nostrils every 2 (two) hours while awake. 05/19/17 06/18/17  Betancourt, Aura Fey, NP  traMADol (ULTRAM) 50 MG tablet Take by mouth every 4 (four) hours as needed.    [provider]  Travoprost, BAK Free, (TRAVATAN) 0.004 % SOLN ophthalmic solution     [provider]  triamterene-hydrochlorothiazide (MAXZIDE) 75-50 MG per tablet Take 0.5  tablets by mouth daily.    [provider]  vitamin E 100 UNIT capsule Take by mouth daily.    [provider]   Meds Ordered and Administered this Visit  Medications - No data to display  BP 139/60 (BP Location: Right Arm)   Pulse 72   Temp 97.9 F (36.6 C) (Oral)   Resp 16   SpO2 100%  No data found.   Physical Exam  Constitutional: She is oriented to person, place, and time. She appears well-developed and well-nourished. No distress.  HENT:  Head: Normocephalic.  Eyes: Pupils are equal, round, and reactive to light.  Neck: Normal range of motion.  Musculoskeletal: Normal range of motion.  Neurological: She is alert and oriented to person, place, and time.  Skin: Skin is warm and dry. She is not diaphoretic.  Psychiatric: She has a normal mood and affect. Her behavior is normal. Judgment and thought content normal.  Nursing note and vitals reviewed.   Urgent Care Course     Procedures (including critical care time)  Labs Review Labs Reviewed  URINALYSIS, COMPLETE (UACMP) WITH MICROSCOPIC - Abnormal; Notable for the following:       Result Value   APPearance CLOUDY (*)    Hgb urine dipstick MODERATE (*)    Protein, ur 100 (*)    Leukocytes, UA MODERATE (*)    Squamous Epithelial / LPF 0-5 (*)    Bacteria,  UA MANY (*)    All other components within normal limits  URINE CULTURE    Imaging Review No results found.   Visual Acuity Review  Right Eye Distance:   Left Eye Distance:   Bilateral Distance:    Right Eye Near:   Left Eye Near:    Bilateral Near:         MDM   1. Lower urinary tract infectious disease    Discharge Medication List as of 06/24/2017  3:56 PM    START taking these medications   Details  bethanechol (URECHOLINE) 25 MG tablet Take 1 tablet (25 mg total) by mouth 3 (three) times daily., Starting Sun 06/24/2017, Normal    nitrofurantoin, macrocrystal-monohydrate, (MACROBID) 100 MG capsule Take 1 capsule (100 mg total) by mouth 2 (two) times daily., Starting Sun 06/24/2017, Normal      Plan: 1. Test/x-ray results and diagnosis reviewed with patient 2. rx as per orders; risks, benefits, potential side effects reviewed with patient 3. Recommend supportive treatment with Increase fluids. Will start on Macrodantin. He just had 2 courses of cephalosporin. She is allergic to Septra does not tolerate Cipro. If she is not improving she should follow-up with her primary care physician at [facility]. 4. F/u prn if symptoms worsen or don't improve     Lorin Picket, PA-C 06/24/17 1607

## 2017-06-27 LAB — URINE CULTURE: Culture: >=100000 — AB

## 2017-07-02 DIAGNOSIS — Z7189 Other specified counseling: Secondary | ICD-10-CM | POA: Diagnosis not present

## 2017-07-02 DIAGNOSIS — R4182 Altered mental status, unspecified: Secondary | ICD-10-CM | POA: Diagnosis not present

## 2017-07-02 DIAGNOSIS — R5381 Other malaise: Secondary | ICD-10-CM | POA: Diagnosis not present

## 2017-07-02 DIAGNOSIS — F419 Anxiety disorder, unspecified: Secondary | ICD-10-CM | POA: Diagnosis not present

## 2017-07-02 DIAGNOSIS — N390 Urinary tract infection, site not specified: Secondary | ICD-10-CM | POA: Diagnosis not present

## 2017-07-02 DIAGNOSIS — G893 Neoplasm related pain (acute) (chronic): Secondary | ICD-10-CM | POA: Diagnosis not present

## 2017-07-02 DIAGNOSIS — F329 Major depressive disorder, single episode, unspecified: Secondary | ICD-10-CM | POA: Diagnosis not present

## 2017-07-02 DIAGNOSIS — Z515 Encounter for palliative care: Secondary | ICD-10-CM | POA: Diagnosis not present

## 2017-07-06 ENCOUNTER — Ambulatory Visit
Admission: EM | Admit: 2017-07-06 | Discharge: 2017-07-06 | Disposition: A | Discharge status: Home / Self Care | Payer: Medicare Other | Attending: Family Medicine | Admitting: Family Medicine

## 2017-07-06 DIAGNOSIS — Z8507 Personal history of malignant neoplasm of pancreas: Secondary | ICD-10-CM | POA: Insufficient documentation

## 2017-07-06 DIAGNOSIS — E7800 Pure hypercholesterolemia, unspecified: Secondary | ICD-10-CM | POA: Insufficient documentation

## 2017-07-06 DIAGNOSIS — Z87440 Personal history of urinary (tract) infections: Secondary | ICD-10-CM | POA: Insufficient documentation

## 2017-07-06 DIAGNOSIS — F4310 Post-traumatic stress disorder, unspecified: Secondary | ICD-10-CM | POA: Insufficient documentation

## 2017-07-06 DIAGNOSIS — J449 Chronic obstructive pulmonary disease, unspecified: Secondary | ICD-10-CM | POA: Diagnosis not present

## 2017-07-06 DIAGNOSIS — E039 Hypothyroidism, unspecified: Secondary | ICD-10-CM | POA: Insufficient documentation

## 2017-07-06 DIAGNOSIS — G4730 Sleep apnea, unspecified: Secondary | ICD-10-CM | POA: Diagnosis not present

## 2017-07-06 DIAGNOSIS — Z7984 Long term (current) use of oral hypoglycemic drugs: Secondary | ICD-10-CM | POA: Diagnosis not present

## 2017-07-06 DIAGNOSIS — K589 Irritable bowel syndrome without diarrhea: Secondary | ICD-10-CM | POA: Insufficient documentation

## 2017-07-06 DIAGNOSIS — Z7982 Long term (current) use of aspirin: Secondary | ICD-10-CM | POA: Diagnosis not present

## 2017-07-06 DIAGNOSIS — G8929 Other chronic pain: Secondary | ICD-10-CM | POA: Diagnosis not present

## 2017-07-06 DIAGNOSIS — Z8719 Personal history of other diseases of the digestive system: Secondary | ICD-10-CM | POA: Insufficient documentation

## 2017-07-06 DIAGNOSIS — K219 Gastro-esophageal reflux disease without esophagitis: Secondary | ICD-10-CM | POA: Insufficient documentation

## 2017-07-06 DIAGNOSIS — R300 Dysuria: Secondary | ICD-10-CM

## 2017-07-06 DIAGNOSIS — F329 Major depressive disorder, single episode, unspecified: Secondary | ICD-10-CM | POA: Insufficient documentation

## 2017-07-06 DIAGNOSIS — I10 Essential (primary) hypertension: Secondary | ICD-10-CM | POA: Diagnosis not present

## 2017-07-06 LAB — URINALYSIS, COMPLETE (UACMP) WITH MICROSCOPIC
BILIRUBIN URINE: NEGATIVE
Bacteria, UA: NONE SEEN
GLUCOSE, UA: NEGATIVE mg/dL
Ketones, ur: NEGATIVE mg/dL
LEUKOCYTES UA: NEGATIVE
NITRITE: NEGATIVE
Protein, ur: NEGATIVE mg/dL
SPECIFIC GRAVITY, URINE: 1.015 (ref 1.005–1.030)
pH: 7 (ref 5.0–8.0)

## 2017-07-06 NOTE — ED Triage Notes (Signed)
Patient states that she is still burning from UTI on 06/24/2017. Patient states that symptoms are very intermittent. Patient states that she is still having confusion.

## 2017-07-06 NOTE — Discharge Instructions (Signed)
Rest. Drink plenty of fluids.   Follow up with urology as discussed.   Follow up with your primary care physician this week as needed. Return to Urgent care for new or worsening concerns.

## 2017-07-06 NOTE — ED Provider Notes (Addendum)
[facility] ____________________________________________  Time seen: Approximately 7:21 PM  I have reviewed the triage vital signs and the nursing notes.   HISTORY  Chief Complaint Recurrent UTI   HPI Crystal Arnold is a 81 y.o. female with past medical history of COPD, HTN, hypothyroidism, pancreatic mass, recurrent UTIs, chronic pain presenting with family at bedside for evaluation of dysuria. Patient seen recently in urgent care of 06/24/17 for similar, and dx uti with culture being positive for e.coli treated with macrobid. Patient follows with [facility] palliative care for pancreatic mass and was restarted on doxycycline daily for uti prophylaxis per patient. Patient reports urinary frequency, burning with urination intermittently. Reports noticing the symptoms in the last 2 days. Patient states that when urinating in urgent care she did not have any burning. Reports that she believes she's been told she has interstitial cystitis before. Daughter reports that this is been a recurrent issue for patient throughout her life. Denies any vaginal discharge, rash or lesions reports some recent change in detergents. Daughter reports due to complications from suspected chemotherapy and pain medications this past year mother's memory has been delayed with intermittent confusion spells that has been ongoing for months without changes, and states she recently had to use depends, but no longer over the last few weeks. Reports previously followed with urology, but not currently. Denies any changes in chronic back pain, chronic abdominal pain. Reports continues to eat and drink overall well. Denies fevers. Denies other competing factors. No alleviating measures attempted.  PCP: awaiting [facility] primary   Past Medical History:  Diagnosis Date  . Back pain, chronic   . COPD (chronic obstructive pulmonary disease) ([place])    in past, no issues currently  . Depression   . Environmental and  seasonal allergies   . GERD (gastroesophageal reflux disease)   . Heart murmur   . History of hiatal hernia   . History of stomach ulcers   . HOH (hard of hearing)    aids  . Hypercholesteremia   . Hypertension   . Hypothyroidism   . IBS (irritable bowel syndrome)   . Left leg weakness    s/p back surgeries  . Mitral valve disorder    prolaspe  . Motion sickness    cars, boats  . Occasional tremors   . Osteoporosis   . Pancreatic cancer ([place])   . PTSD (post-traumatic stress disorder)   . Shortness of breath dyspnea    doe  . Sleep apnea    cpap -doesn't use    There are no active problems to display for this patient.   Past Surgical History:  Procedure Laterality Date  . ABDOMINAL HYSTERECTOMY    . BACK SURGERY     times 3  . CATARACT EXTRACTION W/PHACO Left 08/25/2015   Procedure: CATARACT EXTRACTION PHACO AND INTRAOCULAR LENS PLACEMENT (IOC) WITH IOL COMPLICATED;  Surgeon: Leandrew Koyanagi, MD;  Location: [place];  Service: Ophthalmology;  Laterality: Left;  IVA BLOCK CPAP  . EXCISION OF SKIN TAG     leisions  . EYE SURGERY     left detach retina  . MASTOIDECTOMY    . MEMBRANE PEEL Left 01/12/2016   Procedure: MEMBRANE PEEL;  Surgeon: Milus Height, MD;  Location: [facility];  Service: Ophthalmology;  Laterality: Left;  . NASAL SEPTUM SURGERY    . PARS PLANA VITRECTOMY Left 04/14/2015   Procedure: PARS PLANA VITRECTOMY WITH 25 GAUGE, endolaser, sf6 gas exchange left eye;  Surgeon: Milus Height, MD;  Location:  [facility];  Service: Ophthalmology;  Laterality: Left;  . PARS PLANA VITRECTOMY Left 01/12/2016   Procedure: PARS PLANA VITRECTOMY WITH 25 GAUGE;  Surgeon: Milus Height, MD;  Location: [facility];  Service: Ophthalmology;  Laterality: Left;  SF6:  14% casette lot #[id number] H exp  05/2017  . TONSILLECTOMY       No current facility-administered medications for this encounter.   Current Outpatient Prescriptions:  .  albuterol (PROVENTIL  HFA;VENTOLIN HFA) 108 (90 BASE) MCG/ACT inhaler, Inhale 1-2 puffs into the lungs every 6 (six) hours as needed for wheezing or shortness of breath., Disp: 1 Inhaler, Rfl: 0 .  aspirin 81 MG tablet, Take by mouth daily., Disp: , Rfl:  .  atorvastatin (LIPITOR) 40 MG tablet, Take 40 mg by mouth daily. PM, Disp: , Rfl:  .  bethanechol (URECHOLINE) 25 MG tablet, Take 1 tablet (25 mg total) by mouth 3 (three) times daily., Disp: 90 tablet, Rfl: 0 .  buPROPion (WELLBUTRIN) 100 MG tablet, Take 150 mg by mouth 2 (two) times daily. , Disp: , Rfl:  .  calcium-vitamin D (OSCAL WITH D) 500-200 MG-UNIT tablet, Take 1 tablet by mouth., Disp: , Rfl:  .  clonazePAM (KLONOPIN) 0.5 MG tablet, Take 0.5 mg by mouth every morning. , Disp: , Rfl:  .  donepezil (ARICEPT) 10 MG tablet, Take 10 mg by mouth at bedtime. AM, Disp: , Rfl:  .  esomeprazole (NEXIUM) 40 MG packet, Take 40 mg by mouth daily before breakfast., Disp: , Rfl:  .  felodipine (PLENDIL) 2.5 MG 24 hr tablet, Take 2.5 mg by mouth at bedtime. PM, Disp: , Rfl:  .  fluticasone (FLONASE) 50 MCG/ACT nasal spray, Place 1 spray into both nostrils every morning., Disp: , Rfl:  .  gabapentin (NEURONTIN) 100 MG capsule, Take 100 mg by mouth 2 (two) times daily. , Disp: , Rfl:  .  hydrochlorothiazide (MICROZIDE) 12.5 MG capsule, Take by mouth daily., Disp: , Rfl:  .  levothyroxine (SYNTHROID, LEVOTHROID) 88 MCG tablet, Take 88 mcg by mouth daily before breakfast., Disp: , Rfl:  .  lisinopril (PRINIVIL,ZESTRIL) 30 MG tablet, Take 30 mg by mouth at bedtime., Disp: , Rfl:  .  metFORMIN (GLUCOPHAGE) 500 MG tablet, Take 500 mg by mouth 2 (two) times daily with a meal., Disp: , Rfl:  .  Multiple Vitamin (MULTIVITAMIN) capsule, Take 1 capsule by mouth daily., Disp: , Rfl:  .  Multiple Vitamins-Minerals (PRESERVISION AREDS 2 PO), Take 1 tablet by mouth 2 (two) times daily. , Disp: , Rfl:  .  sertraline (ZOLOFT) 100 MG tablet, Take 100 mg by mouth 2 (two) times daily. , Disp:  , Rfl:  .  simvastatin (ZOCOR) 20 MG tablet, Take 20 mg by mouth daily at 6 PM., Disp: , Rfl:  .  traMADol (ULTRAM) 50 MG tablet, Take by mouth every 4 (four) hours as needed., Disp: , Rfl:  .  Travoprost, BAK Free, (TRAVATAN) 0.004 % SOLN ophthalmic solution, , Disp: , Rfl:  .  triamterene-hydrochlorothiazide (MAXZIDE) 75-50 MG per tablet, Take 0.5 tablets by mouth daily., Disp: , Rfl:  .  vitamin E 100 UNIT capsule, Take by mouth daily., Disp: , Rfl:  .  nitrofurantoin, macrocrystal-monohydrate, (MACROBID) 100 MG capsule, Take 1 capsule (100 mg total) by mouth 2 (two) times daily., Disp: 10 capsule, Rfl: 0 .  sodium chloride (OCEAN) 0.65 % SOLN nasal spray, Place 2 sprays into both nostrils every 2 (two) hours while awake., Disp: , Rfl: 0  Allergies Other;  Pumpkin seed oil; Shellfish allergy; Shellfish-derived products; Spinach; and Sulfa antibiotics   family history Sister: Heart problems Sister: kidney removal  Social History Social History  Substance Use Topics  . Smoking status: Never Smoker  . Smokeless tobacco: Never Used  . Alcohol use No    Review of Systems Constitutional: No fever/chills Cardiovascular: Denies chest pain. Respiratory: Denies shortness of breath. Gastrointestinal: No abdominal pain.  No nausea, no vomiting.  No diarrhea.   Genitourinary: positive for dysuria. Musculoskeletal: Negative for atypical back pain. Skin: Negative for rash.  ____________________________________________   PHYSICAL EXAM:  VITAL SIGNS: ED Triage Vitals  Enc Vitals Group     BP 07/06/17 1905 131/76     Pulse Rate 07/06/17 1905 82     Resp 07/06/17 1905 18     Temp 07/06/17 1905 98.7 F (37.1 C)     Temp Source 07/06/17 1905 Oral     SpO2 07/06/17 1905 95 %     Weight 07/06/17 1903 110 lb (49.9 kg)     Height 07/06/17 1903 5\' 2"  (1.575 m)     Head Circumference --      Peak Flow --      Pain Score 07/06/17 1903 7     Pain Loc --      Pain Edu? --      Excl. in [place]?  --     Constitutional: Alert and oriented. Well appearing and in no acute distress. ENT      Head: Normocephalic and atraumatic.      Mouth/Throat: Mucous membranes are moist.Oropharynx non-erythematous. Cardiovascular: Normal rate, regular rhythm. Grossly normal heart sounds.  Good peripheral circulation. Respiratory: Normal respiratory effort without tachypnea nor retractions. Breath sounds are clear and equal bilaterally. No wheezes, rales, rhonchi. Gastrointestinal: Soft and nontender. Normal Bowel sounds. No CVA tenderness. Musculoskeletal: Steady gait. Neurologic:  Normal speech and language. Speech is normal. No gait instability.  Skin:  Skin is warm, dry. Psychiatric: Mood and affect are normal. Speech and behavior are normal. Patient exhibits appropriate insight and judgment   ___________________________________________   LABS (all labs ordered are listed, but only abnormal results are displayed)  Labs Reviewed  URINALYSIS, COMPLETE (UACMP) WITH MICROSCOPIC - Abnormal; Notable for the following:       Result Value   Hgb urine dipstick TRACE (*)    Squamous Epithelial / LPF 0-5 (*)    All other components within normal limits  URINE CULTURE     PROCEDURES Procedures    INITIAL IMPRESSION / ASSESSMENT AND PLAN / ED COURSE  Pertinent labs & imaging results that were available during my care of the patient were reviewed by me and considered in my medical decision making (see chart for details).  Well-appearing patient. No acute distress. Daughter at bedside. Urinalysis reviewed, doubt UTI. Will culture urine. Encouraged supportive care, monitoring symptoms including detergents and soaps. Encouraged urology follow-up. Patient daughter states that they will obtain [place] urology referral from palliative care. Discussed follow-up and return parameters.   Discussed follow up with Primary care physician this week. Discussed follow up and return parameters including no  resolution or any worsening concerns. Patient verbalized understanding and agreed to plan.   ____________________________________________   FINAL CLINICAL IMPRESSION(S) / ED DIAGNOSES  Final diagnoses:  Dysuria     Discharge Medication List as of 07/06/2017  7:49 PM      Note: This dictation was prepared with Dragon dictation along with smaller phrase technology. Any transcriptional errors that result from this  process are unintentional.         Marylene Land, NP 07/06/17 2106    Marylene Land, NP 07/06/17 2109

## 2017-07-08 LAB — URINE CULTURE

## 2017-07-09 DIAGNOSIS — C259 Malignant neoplasm of pancreas, unspecified: Secondary | ICD-10-CM | POA: Diagnosis not present

## 2017-07-23 DIAGNOSIS — F329 Major depressive disorder, single episode, unspecified: Secondary | ICD-10-CM | POA: Diagnosis not present

## 2017-07-23 DIAGNOSIS — F419 Anxiety disorder, unspecified: Secondary | ICD-10-CM | POA: Diagnosis not present

## 2017-07-23 DIAGNOSIS — R413 Other amnesia: Secondary | ICD-10-CM | POA: Diagnosis not present

## 2017-07-23 DIAGNOSIS — Z515 Encounter for palliative care: Secondary | ICD-10-CM | POA: Diagnosis not present

## 2017-07-23 DIAGNOSIS — G893 Neoplasm related pain (acute) (chronic): Secondary | ICD-10-CM | POA: Diagnosis not present

## 2017-07-30 DIAGNOSIS — Z8639 Personal history of other endocrine, nutritional and metabolic disease: Secondary | ICD-10-CM | POA: Diagnosis not present

## 2017-07-30 DIAGNOSIS — I10 Essential (primary) hypertension: Secondary | ICD-10-CM | POA: Diagnosis not present

## 2017-07-30 DIAGNOSIS — M810 Age-related osteoporosis without current pathological fracture: Secondary | ICD-10-CM | POA: Diagnosis not present

## 2017-07-30 DIAGNOSIS — F329 Major depressive disorder, single episode, unspecified: Secondary | ICD-10-CM | POA: Diagnosis not present

## 2017-07-30 DIAGNOSIS — J449 Chronic obstructive pulmonary disease, unspecified: Secondary | ICD-10-CM | POA: Diagnosis not present

## 2017-07-30 DIAGNOSIS — G8929 Other chronic pain: Secondary | ICD-10-CM | POA: Diagnosis not present

## 2017-07-30 DIAGNOSIS — N390 Urinary tract infection, site not specified: Secondary | ICD-10-CM | POA: Diagnosis not present

## 2017-07-30 DIAGNOSIS — E785 Hyperlipidemia, unspecified: Secondary | ICD-10-CM | POA: Diagnosis not present

## 2017-07-30 DIAGNOSIS — C259 Malignant neoplasm of pancreas, unspecified: Secondary | ICD-10-CM | POA: Diagnosis not present

## 2017-07-30 DIAGNOSIS — E039 Hypothyroidism, unspecified: Secondary | ICD-10-CM | POA: Diagnosis not present

## 2017-07-30 DIAGNOSIS — R109 Unspecified abdominal pain: Secondary | ICD-10-CM | POA: Diagnosis not present

## 2017-07-30 DIAGNOSIS — F411 Generalized anxiety disorder: Secondary | ICD-10-CM | POA: Diagnosis not present

## 2017-08-07 DIAGNOSIS — R350 Frequency of micturition: Secondary | ICD-10-CM | POA: Diagnosis not present

## 2017-08-07 DIAGNOSIS — N3000 Acute cystitis without hematuria: Secondary | ICD-10-CM | POA: Diagnosis not present

## 2017-08-23 DIAGNOSIS — F411 Generalized anxiety disorder: Secondary | ICD-10-CM | POA: Diagnosis not present

## 2017-08-23 DIAGNOSIS — R300 Dysuria: Secondary | ICD-10-CM | POA: Diagnosis not present

## 2017-08-23 DIAGNOSIS — R109 Unspecified abdominal pain: Secondary | ICD-10-CM | POA: Diagnosis not present

## 2017-08-23 DIAGNOSIS — F329 Major depressive disorder, single episode, unspecified: Secondary | ICD-10-CM | POA: Diagnosis not present

## 2017-08-23 DIAGNOSIS — E039 Hypothyroidism, unspecified: Secondary | ICD-10-CM | POA: Diagnosis not present

## 2017-08-23 DIAGNOSIS — I10 Essential (primary) hypertension: Secondary | ICD-10-CM | POA: Diagnosis not present

## 2017-08-23 DIAGNOSIS — E785 Hyperlipidemia, unspecified: Secondary | ICD-10-CM | POA: Diagnosis not present

## 2017-08-23 DIAGNOSIS — N390 Urinary tract infection, site not specified: Secondary | ICD-10-CM | POA: Diagnosis not present

## 2017-08-23 DIAGNOSIS — M810 Age-related osteoporosis without current pathological fracture: Secondary | ICD-10-CM | POA: Diagnosis not present

## 2017-08-23 DIAGNOSIS — C259 Malignant neoplasm of pancreas, unspecified: Secondary | ICD-10-CM | POA: Diagnosis not present

## 2017-08-23 DIAGNOSIS — J449 Chronic obstructive pulmonary disease, unspecified: Secondary | ICD-10-CM | POA: Diagnosis not present

## 2017-08-23 DIAGNOSIS — Z8639 Personal history of other endocrine, nutritional and metabolic disease: Secondary | ICD-10-CM | POA: Diagnosis not present

## 2017-08-27 DIAGNOSIS — F411 Generalized anxiety disorder: Secondary | ICD-10-CM | POA: Diagnosis not present

## 2017-08-27 DIAGNOSIS — Z515 Encounter for palliative care: Secondary | ICD-10-CM | POA: Diagnosis not present

## 2017-08-27 DIAGNOSIS — G893 Neoplasm related pain (acute) (chronic): Secondary | ICD-10-CM | POA: Diagnosis not present

## 2017-08-27 DIAGNOSIS — N390 Urinary tract infection, site not specified: Secondary | ICD-10-CM | POA: Diagnosis not present

## 2017-09-10 DIAGNOSIS — G893 Neoplasm related pain (acute) (chronic): Secondary | ICD-10-CM | POA: Diagnosis not present

## 2017-09-10 DIAGNOSIS — N390 Urinary tract infection, site not specified: Secondary | ICD-10-CM | POA: Diagnosis not present

## 2017-09-10 DIAGNOSIS — E876 Hypokalemia: Secondary | ICD-10-CM | POA: Diagnosis not present

## 2017-09-10 DIAGNOSIS — K838 Other specified diseases of biliary tract: Secondary | ICD-10-CM | POA: Diagnosis not present

## 2017-09-10 DIAGNOSIS — R945 Abnormal results of liver function studies: Secondary | ICD-10-CM | POA: Diagnosis not present

## 2017-09-10 DIAGNOSIS — C250 Malignant neoplasm of head of pancreas: Secondary | ICD-10-CM | POA: Diagnosis not present

## 2017-09-10 DIAGNOSIS — F419 Anxiety disorder, unspecified: Secondary | ICD-10-CM | POA: Diagnosis not present

## 2017-09-10 DIAGNOSIS — F329 Major depressive disorder, single episode, unspecified: Secondary | ICD-10-CM | POA: Diagnosis not present

## 2017-09-18 DIAGNOSIS — E039 Hypothyroidism, unspecified: Secondary | ICD-10-CM | POA: Diagnosis not present

## 2017-09-18 DIAGNOSIS — C250 Malignant neoplasm of head of pancreas: Secondary | ICD-10-CM | POA: Diagnosis not present

## 2017-09-18 DIAGNOSIS — K219 Gastro-esophageal reflux disease without esophagitis: Secondary | ICD-10-CM | POA: Diagnosis not present

## 2017-09-18 DIAGNOSIS — F0390 Unspecified dementia without behavioral disturbance: Secondary | ICD-10-CM | POA: Diagnosis not present

## 2017-09-18 DIAGNOSIS — Z87440 Personal history of urinary (tract) infections: Secondary | ICD-10-CM | POA: Diagnosis not present

## 2017-09-18 DIAGNOSIS — R17 Unspecified jaundice: Secondary | ICD-10-CM | POA: Diagnosis not present

## 2017-09-18 DIAGNOSIS — K831 Obstruction of bile duct: Secondary | ICD-10-CM | POA: Diagnosis not present

## 2017-09-18 DIAGNOSIS — Z7722 Contact with and (suspected) exposure to environmental tobacco smoke (acute) (chronic): Secondary | ICD-10-CM | POA: Diagnosis not present

## 2017-09-18 DIAGNOSIS — H409 Unspecified glaucoma: Secondary | ICD-10-CM | POA: Diagnosis not present

## 2017-09-18 DIAGNOSIS — G4730 Sleep apnea, unspecified: Secondary | ICD-10-CM | POA: Diagnosis not present

## 2017-09-18 DIAGNOSIS — F329 Major depressive disorder, single episode, unspecified: Secondary | ICD-10-CM | POA: Diagnosis not present

## 2017-09-18 DIAGNOSIS — E119 Type 2 diabetes mellitus without complications: Secondary | ICD-10-CM | POA: Diagnosis not present

## 2017-09-18 DIAGNOSIS — J449 Chronic obstructive pulmonary disease, unspecified: Secondary | ICD-10-CM | POA: Diagnosis not present

## 2017-09-18 DIAGNOSIS — K838 Other specified diseases of biliary tract: Secondary | ICD-10-CM | POA: Diagnosis not present

## 2017-09-18 DIAGNOSIS — I10 Essential (primary) hypertension: Secondary | ICD-10-CM | POA: Diagnosis not present

## 2017-09-18 DIAGNOSIS — Z4659 Encounter for fitting and adjustment of other gastrointestinal appliance and device: Secondary | ICD-10-CM | POA: Diagnosis not present

## 2017-09-18 DIAGNOSIS — R748 Abnormal levels of other serum enzymes: Secondary | ICD-10-CM | POA: Diagnosis not present

## 2017-10-08 DIAGNOSIS — F419 Anxiety disorder, unspecified: Secondary | ICD-10-CM | POA: Diagnosis not present

## 2017-10-08 DIAGNOSIS — G893 Neoplasm related pain (acute) (chronic): Secondary | ICD-10-CM | POA: Diagnosis not present

## 2017-10-08 DIAGNOSIS — Z87440 Personal history of urinary (tract) infections: Secondary | ICD-10-CM | POA: Diagnosis not present

## 2017-10-08 DIAGNOSIS — C250 Malignant neoplasm of head of pancreas: Secondary | ICD-10-CM | POA: Diagnosis not present

## 2017-10-08 DIAGNOSIS — R630 Anorexia: Secondary | ICD-10-CM | POA: Diagnosis not present

## 2017-10-08 DIAGNOSIS — Z515 Encounter for palliative care: Secondary | ICD-10-CM | POA: Diagnosis not present

## 2017-11-05 DIAGNOSIS — R748 Abnormal levels of other serum enzymes: Secondary | ICD-10-CM | POA: Diagnosis not present

## 2017-11-05 DIAGNOSIS — C259 Malignant neoplasm of pancreas, unspecified: Secondary | ICD-10-CM | POA: Diagnosis not present

## 2017-11-05 DIAGNOSIS — E876 Hypokalemia: Secondary | ICD-10-CM | POA: Diagnosis not present

## 2017-11-05 DIAGNOSIS — R5383 Other fatigue: Secondary | ICD-10-CM | POA: Diagnosis not present

## 2017-11-05 DIAGNOSIS — Z23 Encounter for immunization: Secondary | ICD-10-CM | POA: Diagnosis not present

## 2017-11-05 DIAGNOSIS — R197 Diarrhea, unspecified: Secondary | ICD-10-CM | POA: Diagnosis not present

## 2017-12-10 DIAGNOSIS — R530 Neoplastic (malignant) related fatigue: Secondary | ICD-10-CM | POA: Diagnosis not present

## 2017-12-10 DIAGNOSIS — K909 Intestinal malabsorption, unspecified: Secondary | ICD-10-CM | POA: Diagnosis not present

## 2017-12-10 DIAGNOSIS — Z515 Encounter for palliative care: Secondary | ICD-10-CM | POA: Diagnosis not present

## 2017-12-10 DIAGNOSIS — C259 Malignant neoplasm of pancreas, unspecified: Secondary | ICD-10-CM | POA: Diagnosis not present

## 2017-12-10 DIAGNOSIS — G893 Neoplasm related pain (acute) (chronic): Secondary | ICD-10-CM | POA: Diagnosis not present

## 2017-12-10 DIAGNOSIS — R5381 Other malaise: Secondary | ICD-10-CM | POA: Diagnosis not present

## 2017-12-10 DIAGNOSIS — R197 Diarrhea, unspecified: Secondary | ICD-10-CM | POA: Diagnosis not present

## 2017-12-10 DIAGNOSIS — R0789 Other chest pain: Secondary | ICD-10-CM | POA: Diagnosis not present

## 2018-01-10 DIAGNOSIS — R5383 Other fatigue: Secondary | ICD-10-CM | POA: Diagnosis not present

## 2018-01-10 DIAGNOSIS — R748 Abnormal levels of other serum enzymes: Secondary | ICD-10-CM | POA: Diagnosis not present

## 2018-01-10 DIAGNOSIS — C259 Malignant neoplasm of pancreas, unspecified: Secondary | ICD-10-CM | POA: Diagnosis not present

## 2018-01-18 DIAGNOSIS — M810 Age-related osteoporosis without current pathological fracture: Secondary | ICD-10-CM | POA: Diagnosis not present

## 2018-01-18 DIAGNOSIS — F329 Major depressive disorder, single episode, unspecified: Secondary | ICD-10-CM | POA: Diagnosis not present

## 2018-01-18 DIAGNOSIS — J449 Chronic obstructive pulmonary disease, unspecified: Secondary | ICD-10-CM | POA: Diagnosis not present

## 2018-01-18 DIAGNOSIS — G893 Neoplasm related pain (acute) (chronic): Secondary | ICD-10-CM | POA: Diagnosis not present

## 2018-01-18 DIAGNOSIS — F0390 Unspecified dementia without behavioral disturbance: Secondary | ICD-10-CM | POA: Diagnosis not present

## 2018-01-18 DIAGNOSIS — E039 Hypothyroidism, unspecified: Secondary | ICD-10-CM | POA: Diagnosis not present

## 2018-01-18 DIAGNOSIS — C259 Malignant neoplasm of pancreas, unspecified: Secondary | ICD-10-CM | POA: Diagnosis not present

## 2018-01-18 DIAGNOSIS — F419 Anxiety disorder, unspecified: Secondary | ICD-10-CM | POA: Diagnosis not present

## 2018-01-18 DIAGNOSIS — R634 Abnormal weight loss: Secondary | ICD-10-CM | POA: Diagnosis not present

## 2018-01-18 DIAGNOSIS — I10 Essential (primary) hypertension: Secondary | ICD-10-CM | POA: Diagnosis not present

## 2018-01-18 DIAGNOSIS — E785 Hyperlipidemia, unspecified: Secondary | ICD-10-CM | POA: Diagnosis not present

## 2018-01-18 DIAGNOSIS — Z87440 Personal history of urinary (tract) infections: Secondary | ICD-10-CM | POA: Diagnosis not present

## 2018-01-19 DIAGNOSIS — I10 Essential (primary) hypertension: Secondary | ICD-10-CM | POA: Diagnosis not present

## 2018-01-19 DIAGNOSIS — F329 Major depressive disorder, single episode, unspecified: Secondary | ICD-10-CM | POA: Diagnosis not present

## 2018-01-19 DIAGNOSIS — F419 Anxiety disorder, unspecified: Secondary | ICD-10-CM | POA: Diagnosis not present

## 2018-01-19 DIAGNOSIS — C259 Malignant neoplasm of pancreas, unspecified: Secondary | ICD-10-CM | POA: Diagnosis not present

## 2018-01-19 DIAGNOSIS — G893 Neoplasm related pain (acute) (chronic): Secondary | ICD-10-CM | POA: Diagnosis not present

## 2018-01-19 DIAGNOSIS — R634 Abnormal weight loss: Secondary | ICD-10-CM | POA: Diagnosis not present

## 2018-01-20 DIAGNOSIS — G893 Neoplasm related pain (acute) (chronic): Secondary | ICD-10-CM | POA: Diagnosis not present

## 2018-01-20 DIAGNOSIS — F419 Anxiety disorder, unspecified: Secondary | ICD-10-CM | POA: Diagnosis not present

## 2018-01-20 DIAGNOSIS — F329 Major depressive disorder, single episode, unspecified: Secondary | ICD-10-CM | POA: Diagnosis not present

## 2018-01-20 DIAGNOSIS — C259 Malignant neoplasm of pancreas, unspecified: Secondary | ICD-10-CM | POA: Diagnosis not present

## 2018-01-20 DIAGNOSIS — I10 Essential (primary) hypertension: Secondary | ICD-10-CM | POA: Diagnosis not present

## 2018-01-20 DIAGNOSIS — R634 Abnormal weight loss: Secondary | ICD-10-CM | POA: Diagnosis not present

## 2018-01-21 DIAGNOSIS — G893 Neoplasm related pain (acute) (chronic): Secondary | ICD-10-CM | POA: Diagnosis not present

## 2018-01-21 DIAGNOSIS — F419 Anxiety disorder, unspecified: Secondary | ICD-10-CM | POA: Diagnosis not present

## 2018-01-21 DIAGNOSIS — R634 Abnormal weight loss: Secondary | ICD-10-CM | POA: Diagnosis not present

## 2018-01-21 DIAGNOSIS — F329 Major depressive disorder, single episode, unspecified: Secondary | ICD-10-CM | POA: Diagnosis not present

## 2018-01-21 DIAGNOSIS — I10 Essential (primary) hypertension: Secondary | ICD-10-CM | POA: Diagnosis not present

## 2018-01-21 DIAGNOSIS — C259 Malignant neoplasm of pancreas, unspecified: Secondary | ICD-10-CM | POA: Diagnosis not present

## 2018-01-22 DIAGNOSIS — F419 Anxiety disorder, unspecified: Secondary | ICD-10-CM | POA: Diagnosis not present

## 2018-01-22 DIAGNOSIS — F329 Major depressive disorder, single episode, unspecified: Secondary | ICD-10-CM | POA: Diagnosis not present

## 2018-01-22 DIAGNOSIS — C259 Malignant neoplasm of pancreas, unspecified: Secondary | ICD-10-CM | POA: Diagnosis not present

## 2018-01-22 DIAGNOSIS — R634 Abnormal weight loss: Secondary | ICD-10-CM | POA: Diagnosis not present

## 2018-01-22 DIAGNOSIS — I10 Essential (primary) hypertension: Secondary | ICD-10-CM | POA: Diagnosis not present

## 2018-01-22 DIAGNOSIS — G893 Neoplasm related pain (acute) (chronic): Secondary | ICD-10-CM | POA: Diagnosis not present

## 2018-01-23 DIAGNOSIS — R634 Abnormal weight loss: Secondary | ICD-10-CM | POA: Diagnosis not present

## 2018-01-23 DIAGNOSIS — G893 Neoplasm related pain (acute) (chronic): Secondary | ICD-10-CM | POA: Diagnosis not present

## 2018-01-23 DIAGNOSIS — C259 Malignant neoplasm of pancreas, unspecified: Secondary | ICD-10-CM | POA: Diagnosis not present

## 2018-01-23 DIAGNOSIS — F419 Anxiety disorder, unspecified: Secondary | ICD-10-CM | POA: Diagnosis not present

## 2018-01-23 DIAGNOSIS — I10 Essential (primary) hypertension: Secondary | ICD-10-CM | POA: Diagnosis not present

## 2018-01-23 DIAGNOSIS — F329 Major depressive disorder, single episode, unspecified: Secondary | ICD-10-CM | POA: Diagnosis not present

## 2018-01-24 DIAGNOSIS — R634 Abnormal weight loss: Secondary | ICD-10-CM | POA: Diagnosis not present

## 2018-01-24 DIAGNOSIS — I10 Essential (primary) hypertension: Secondary | ICD-10-CM | POA: Diagnosis not present

## 2018-01-24 DIAGNOSIS — F419 Anxiety disorder, unspecified: Secondary | ICD-10-CM | POA: Diagnosis not present

## 2018-01-24 DIAGNOSIS — F329 Major depressive disorder, single episode, unspecified: Secondary | ICD-10-CM | POA: Diagnosis not present

## 2018-01-24 DIAGNOSIS — C259 Malignant neoplasm of pancreas, unspecified: Secondary | ICD-10-CM | POA: Diagnosis not present

## 2018-01-24 DIAGNOSIS — G893 Neoplasm related pain (acute) (chronic): Secondary | ICD-10-CM | POA: Diagnosis not present

## 2018-01-25 DIAGNOSIS — I10 Essential (primary) hypertension: Secondary | ICD-10-CM | POA: Diagnosis not present

## 2018-01-25 DIAGNOSIS — F329 Major depressive disorder, single episode, unspecified: Secondary | ICD-10-CM | POA: Diagnosis not present

## 2018-01-25 DIAGNOSIS — F419 Anxiety disorder, unspecified: Secondary | ICD-10-CM | POA: Diagnosis not present

## 2018-01-25 DIAGNOSIS — G893 Neoplasm related pain (acute) (chronic): Secondary | ICD-10-CM | POA: Diagnosis not present

## 2018-01-25 DIAGNOSIS — R634 Abnormal weight loss: Secondary | ICD-10-CM | POA: Diagnosis not present

## 2018-01-25 DIAGNOSIS — C259 Malignant neoplasm of pancreas, unspecified: Secondary | ICD-10-CM | POA: Diagnosis not present

## 2018-01-26 DIAGNOSIS — R634 Abnormal weight loss: Secondary | ICD-10-CM | POA: Diagnosis not present

## 2018-01-26 DIAGNOSIS — I10 Essential (primary) hypertension: Secondary | ICD-10-CM | POA: Diagnosis not present

## 2018-01-26 DIAGNOSIS — G893 Neoplasm related pain (acute) (chronic): Secondary | ICD-10-CM | POA: Diagnosis not present

## 2018-01-26 DIAGNOSIS — F419 Anxiety disorder, unspecified: Secondary | ICD-10-CM | POA: Diagnosis not present

## 2018-01-26 DIAGNOSIS — C259 Malignant neoplasm of pancreas, unspecified: Secondary | ICD-10-CM | POA: Diagnosis not present

## 2018-01-26 DIAGNOSIS — F329 Major depressive disorder, single episode, unspecified: Secondary | ICD-10-CM | POA: Diagnosis not present

## 2018-01-27 DIAGNOSIS — R634 Abnormal weight loss: Secondary | ICD-10-CM | POA: Diagnosis not present

## 2018-01-27 DIAGNOSIS — G893 Neoplasm related pain (acute) (chronic): Secondary | ICD-10-CM | POA: Diagnosis not present

## 2018-01-27 DIAGNOSIS — C259 Malignant neoplasm of pancreas, unspecified: Secondary | ICD-10-CM | POA: Diagnosis not present

## 2018-01-27 DIAGNOSIS — F329 Major depressive disorder, single episode, unspecified: Secondary | ICD-10-CM | POA: Diagnosis not present

## 2018-01-27 DIAGNOSIS — F419 Anxiety disorder, unspecified: Secondary | ICD-10-CM | POA: Diagnosis not present

## 2018-01-27 DIAGNOSIS — I10 Essential (primary) hypertension: Secondary | ICD-10-CM | POA: Diagnosis not present

## 2018-01-28 DIAGNOSIS — C259 Malignant neoplasm of pancreas, unspecified: Secondary | ICD-10-CM | POA: Diagnosis not present

## 2018-01-28 DIAGNOSIS — I10 Essential (primary) hypertension: Secondary | ICD-10-CM | POA: Diagnosis not present

## 2018-01-28 DIAGNOSIS — R634 Abnormal weight loss: Secondary | ICD-10-CM | POA: Diagnosis not present

## 2018-01-28 DIAGNOSIS — G893 Neoplasm related pain (acute) (chronic): Secondary | ICD-10-CM | POA: Diagnosis not present

## 2018-01-28 DIAGNOSIS — F419 Anxiety disorder, unspecified: Secondary | ICD-10-CM | POA: Diagnosis not present

## 2018-01-28 DIAGNOSIS — F329 Major depressive disorder, single episode, unspecified: Secondary | ICD-10-CM | POA: Diagnosis not present

## 2018-01-29 DIAGNOSIS — R634 Abnormal weight loss: Secondary | ICD-10-CM | POA: Diagnosis not present

## 2018-01-29 DIAGNOSIS — I10 Essential (primary) hypertension: Secondary | ICD-10-CM | POA: Diagnosis not present

## 2018-01-29 DIAGNOSIS — F329 Major depressive disorder, single episode, unspecified: Secondary | ICD-10-CM | POA: Diagnosis not present

## 2018-01-29 DIAGNOSIS — G893 Neoplasm related pain (acute) (chronic): Secondary | ICD-10-CM | POA: Diagnosis not present

## 2018-01-29 DIAGNOSIS — F419 Anxiety disorder, unspecified: Secondary | ICD-10-CM | POA: Diagnosis not present

## 2018-01-29 DIAGNOSIS — C259 Malignant neoplasm of pancreas, unspecified: Secondary | ICD-10-CM | POA: Diagnosis not present

## 2018-01-30 DIAGNOSIS — C259 Malignant neoplasm of pancreas, unspecified: Secondary | ICD-10-CM | POA: Diagnosis not present

## 2018-01-30 DIAGNOSIS — G893 Neoplasm related pain (acute) (chronic): Secondary | ICD-10-CM | POA: Diagnosis not present

## 2018-01-30 DIAGNOSIS — I10 Essential (primary) hypertension: Secondary | ICD-10-CM | POA: Diagnosis not present

## 2018-01-30 DIAGNOSIS — F329 Major depressive disorder, single episode, unspecified: Secondary | ICD-10-CM | POA: Diagnosis not present

## 2018-01-30 DIAGNOSIS — R634 Abnormal weight loss: Secondary | ICD-10-CM | POA: Diagnosis not present

## 2018-01-30 DIAGNOSIS — F419 Anxiety disorder, unspecified: Secondary | ICD-10-CM | POA: Diagnosis not present

## 2018-01-31 DIAGNOSIS — I10 Essential (primary) hypertension: Secondary | ICD-10-CM | POA: Diagnosis not present

## 2018-01-31 DIAGNOSIS — F419 Anxiety disorder, unspecified: Secondary | ICD-10-CM | POA: Diagnosis not present

## 2018-01-31 DIAGNOSIS — F329 Major depressive disorder, single episode, unspecified: Secondary | ICD-10-CM | POA: Diagnosis not present

## 2018-01-31 DIAGNOSIS — C259 Malignant neoplasm of pancreas, unspecified: Secondary | ICD-10-CM | POA: Diagnosis not present

## 2018-01-31 DIAGNOSIS — R634 Abnormal weight loss: Secondary | ICD-10-CM | POA: Diagnosis not present

## 2018-01-31 DIAGNOSIS — G893 Neoplasm related pain (acute) (chronic): Secondary | ICD-10-CM | POA: Diagnosis not present

## 2018-02-01 DIAGNOSIS — C259 Malignant neoplasm of pancreas, unspecified: Secondary | ICD-10-CM | POA: Diagnosis not present

## 2018-02-01 DIAGNOSIS — E785 Hyperlipidemia, unspecified: Secondary | ICD-10-CM | POA: Diagnosis not present

## 2018-02-01 DIAGNOSIS — G893 Neoplasm related pain (acute) (chronic): Secondary | ICD-10-CM | POA: Diagnosis not present

## 2018-02-01 DIAGNOSIS — Z87440 Personal history of urinary (tract) infections: Secondary | ICD-10-CM | POA: Diagnosis not present

## 2018-02-01 DIAGNOSIS — F329 Major depressive disorder, single episode, unspecified: Secondary | ICD-10-CM | POA: Diagnosis not present

## 2018-02-01 DIAGNOSIS — F419 Anxiety disorder, unspecified: Secondary | ICD-10-CM | POA: Diagnosis not present

## 2018-02-01 DIAGNOSIS — M810 Age-related osteoporosis without current pathological fracture: Secondary | ICD-10-CM | POA: Diagnosis not present

## 2018-02-01 DIAGNOSIS — I10 Essential (primary) hypertension: Secondary | ICD-10-CM | POA: Diagnosis not present

## 2018-02-01 DIAGNOSIS — E039 Hypothyroidism, unspecified: Secondary | ICD-10-CM | POA: Diagnosis not present

## 2018-02-01 DIAGNOSIS — J449 Chronic obstructive pulmonary disease, unspecified: Secondary | ICD-10-CM | POA: Diagnosis not present

## 2018-02-01 DIAGNOSIS — F0390 Unspecified dementia without behavioral disturbance: Secondary | ICD-10-CM | POA: Diagnosis not present

## 2018-02-01 DIAGNOSIS — R634 Abnormal weight loss: Secondary | ICD-10-CM | POA: Diagnosis not present

## 2018-02-02 DIAGNOSIS — F419 Anxiety disorder, unspecified: Secondary | ICD-10-CM | POA: Diagnosis not present

## 2018-02-02 DIAGNOSIS — F329 Major depressive disorder, single episode, unspecified: Secondary | ICD-10-CM | POA: Diagnosis not present

## 2018-02-02 DIAGNOSIS — I10 Essential (primary) hypertension: Secondary | ICD-10-CM | POA: Diagnosis not present

## 2018-02-02 DIAGNOSIS — G893 Neoplasm related pain (acute) (chronic): Secondary | ICD-10-CM | POA: Diagnosis not present

## 2018-02-02 DIAGNOSIS — C259 Malignant neoplasm of pancreas, unspecified: Secondary | ICD-10-CM | POA: Diagnosis not present

## 2018-02-02 DIAGNOSIS — R634 Abnormal weight loss: Secondary | ICD-10-CM | POA: Diagnosis not present

## 2018-02-03 DIAGNOSIS — C259 Malignant neoplasm of pancreas, unspecified: Secondary | ICD-10-CM | POA: Diagnosis not present

## 2018-02-03 DIAGNOSIS — F419 Anxiety disorder, unspecified: Secondary | ICD-10-CM | POA: Diagnosis not present

## 2018-02-03 DIAGNOSIS — F329 Major depressive disorder, single episode, unspecified: Secondary | ICD-10-CM | POA: Diagnosis not present

## 2018-02-03 DIAGNOSIS — G893 Neoplasm related pain (acute) (chronic): Secondary | ICD-10-CM | POA: Diagnosis not present

## 2018-02-03 DIAGNOSIS — R634 Abnormal weight loss: Secondary | ICD-10-CM | POA: Diagnosis not present

## 2018-02-03 DIAGNOSIS — I10 Essential (primary) hypertension: Secondary | ICD-10-CM | POA: Diagnosis not present

## 2018-02-04 DIAGNOSIS — C259 Malignant neoplasm of pancreas, unspecified: Secondary | ICD-10-CM | POA: Diagnosis not present

## 2018-02-04 DIAGNOSIS — G893 Neoplasm related pain (acute) (chronic): Secondary | ICD-10-CM | POA: Diagnosis not present

## 2018-02-04 DIAGNOSIS — F329 Major depressive disorder, single episode, unspecified: Secondary | ICD-10-CM | POA: Diagnosis not present

## 2018-02-04 DIAGNOSIS — I10 Essential (primary) hypertension: Secondary | ICD-10-CM | POA: Diagnosis not present

## 2018-02-04 DIAGNOSIS — F419 Anxiety disorder, unspecified: Secondary | ICD-10-CM | POA: Diagnosis not present

## 2018-02-04 DIAGNOSIS — R634 Abnormal weight loss: Secondary | ICD-10-CM | POA: Diagnosis not present

## 2018-02-05 DIAGNOSIS — F329 Major depressive disorder, single episode, unspecified: Secondary | ICD-10-CM | POA: Diagnosis not present

## 2018-02-05 DIAGNOSIS — F419 Anxiety disorder, unspecified: Secondary | ICD-10-CM | POA: Diagnosis not present

## 2018-02-05 DIAGNOSIS — C259 Malignant neoplasm of pancreas, unspecified: Secondary | ICD-10-CM | POA: Diagnosis not present

## 2018-02-05 DIAGNOSIS — R634 Abnormal weight loss: Secondary | ICD-10-CM | POA: Diagnosis not present

## 2018-02-05 DIAGNOSIS — G893 Neoplasm related pain (acute) (chronic): Secondary | ICD-10-CM | POA: Diagnosis not present

## 2018-02-05 DIAGNOSIS — I10 Essential (primary) hypertension: Secondary | ICD-10-CM | POA: Diagnosis not present

## 2018-02-06 DIAGNOSIS — F329 Major depressive disorder, single episode, unspecified: Secondary | ICD-10-CM | POA: Diagnosis not present

## 2018-02-06 DIAGNOSIS — C259 Malignant neoplasm of pancreas, unspecified: Secondary | ICD-10-CM | POA: Diagnosis not present

## 2018-02-06 DIAGNOSIS — G893 Neoplasm related pain (acute) (chronic): Secondary | ICD-10-CM | POA: Diagnosis not present

## 2018-02-06 DIAGNOSIS — R634 Abnormal weight loss: Secondary | ICD-10-CM | POA: Diagnosis not present

## 2018-02-06 DIAGNOSIS — F419 Anxiety disorder, unspecified: Secondary | ICD-10-CM | POA: Diagnosis not present

## 2018-02-06 DIAGNOSIS — I10 Essential (primary) hypertension: Secondary | ICD-10-CM | POA: Diagnosis not present

## 2018-02-07 DIAGNOSIS — F419 Anxiety disorder, unspecified: Secondary | ICD-10-CM | POA: Diagnosis not present

## 2018-02-07 DIAGNOSIS — C259 Malignant neoplasm of pancreas, unspecified: Secondary | ICD-10-CM | POA: Diagnosis not present

## 2018-02-07 DIAGNOSIS — R634 Abnormal weight loss: Secondary | ICD-10-CM | POA: Diagnosis not present

## 2018-02-07 DIAGNOSIS — G893 Neoplasm related pain (acute) (chronic): Secondary | ICD-10-CM | POA: Diagnosis not present

## 2018-02-07 DIAGNOSIS — F329 Major depressive disorder, single episode, unspecified: Secondary | ICD-10-CM | POA: Diagnosis not present

## 2018-02-07 DIAGNOSIS — I10 Essential (primary) hypertension: Secondary | ICD-10-CM | POA: Diagnosis not present

## 2018-02-08 DIAGNOSIS — R634 Abnormal weight loss: Secondary | ICD-10-CM | POA: Diagnosis not present

## 2018-02-08 DIAGNOSIS — F329 Major depressive disorder, single episode, unspecified: Secondary | ICD-10-CM | POA: Diagnosis not present

## 2018-02-08 DIAGNOSIS — I10 Essential (primary) hypertension: Secondary | ICD-10-CM | POA: Diagnosis not present

## 2018-02-08 DIAGNOSIS — F419 Anxiety disorder, unspecified: Secondary | ICD-10-CM | POA: Diagnosis not present

## 2018-02-08 DIAGNOSIS — C259 Malignant neoplasm of pancreas, unspecified: Secondary | ICD-10-CM | POA: Diagnosis not present

## 2018-02-08 DIAGNOSIS — G893 Neoplasm related pain (acute) (chronic): Secondary | ICD-10-CM | POA: Diagnosis not present

## 2018-03-04 DEATH — deceased
